# Patient Record
Sex: Male | Born: 1945 | Race: White | Hispanic: No | Marital: Single | Smoking: Former smoker
Health system: Southern US, Community
[De-identification: ages and names within clinical notes are randomized; demographics above are authoritative.]

## PROBLEM LIST (undated history)

## (undated) DIAGNOSIS — F419 Anxiety disorder, unspecified: Secondary | ICD-10-CM

## (undated) DIAGNOSIS — Z978 Presence of other specified devices: Secondary | ICD-10-CM

## (undated) DIAGNOSIS — J449 Chronic obstructive pulmonary disease, unspecified: Secondary | ICD-10-CM

## (undated) DIAGNOSIS — Z960 Presence of urogenital implants: Secondary | ICD-10-CM

## (undated) DIAGNOSIS — R630 Anorexia: Secondary | ICD-10-CM

## (undated) DIAGNOSIS — M1990 Unspecified osteoarthritis, unspecified site: Secondary | ICD-10-CM

## (undated) DIAGNOSIS — R634 Abnormal weight loss: Secondary | ICD-10-CM

## (undated) DIAGNOSIS — IMO0001 Reserved for inherently not codable concepts without codable children: Secondary | ICD-10-CM

## (undated) HISTORY — PX: REPLACEMENT TOTAL KNEE: SUR1224

## (undated) HISTORY — PX: JOINT REPLACEMENT: SHX530

## (undated) HISTORY — DX: Chronic obstructive pulmonary disease, unspecified: J44.9

## (undated) HISTORY — PX: FRACTURE SURGERY: SHX138

## (undated) HISTORY — PX: EYE SURGERY: SHX253

---

## 2011-03-09 ENCOUNTER — Emergency Department (HOSPITAL_COMMUNITY): Payer: Medicare PPO

## 2011-03-09 ENCOUNTER — Encounter (HOSPITAL_COMMUNITY): Payer: Self-pay | Admitting: Emergency Medicine

## 2011-03-09 ENCOUNTER — Emergency Department (HOSPITAL_COMMUNITY)
Admission: EM | Admit: 2011-03-09 | Discharge: 2011-03-09 | Disposition: A | Discharge status: Home / Self Care | Payer: Medicare PPO | Attending: Emergency Medicine | Admitting: Emergency Medicine

## 2011-03-09 DIAGNOSIS — S0100XA Unspecified open wound of scalp, initial encounter: Secondary | ICD-10-CM | POA: Insufficient documentation

## 2011-03-09 DIAGNOSIS — S0990XA Unspecified injury of head, initial encounter: Secondary | ICD-10-CM | POA: Insufficient documentation

## 2011-03-09 DIAGNOSIS — S0101XA Laceration without foreign body of scalp, initial encounter: Secondary | ICD-10-CM

## 2011-03-09 DIAGNOSIS — Z96659 Presence of unspecified artificial knee joint: Secondary | ICD-10-CM | POA: Insufficient documentation

## 2011-03-09 MED ORDER — LIDOCAINE HCL 2 % IJ SOLN
INTRAMUSCULAR | Status: AC
  Filled 2011-03-09: qty 1

## 2011-03-09 MED ORDER — LIDOCAINE-EPINEPHRINE 2 %-1:100000 IJ SOLN: 30.0000 mL | Freq: Once | INTRAMUSCULAR | Status: DC

## 2011-03-09 MED ORDER — TETANUS-DIPHTH-ACELL PERTUSSIS 5-2.5-18.5 LF-MCG/0.5 IM SUSP
0.5000 mL | Freq: Once | INTRAMUSCULAR | Status: AC
  Administered 2011-03-09: 0.5 mL via INTRAMUSCULAR
  Filled 2011-03-09: qty 0.5
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
## 2011-03-09 NOTE — ED Notes (Signed)
Pd notified and patient talked with them about the [place]

## 2011-03-09 NOTE — ED Notes (Signed)
Patient stable upon discharge.  

## 2011-03-09 NOTE — ED Notes (Addendum)
Family friend states that he quits drinking but begins drinking again around [place] day and Memorial day because he lost a lot of friends in [place]. He gets depressed and doesn't have any family around.

## 2011-03-09 NOTE — ED Notes (Signed)
[facility]- [phone] Glynda Jaeger  Please call [facility] to pick pt up.
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
## 2011-03-16 ENCOUNTER — Encounter (HOSPITAL_COMMUNITY): Payer: Self-pay | Admitting: Emergency Medicine

## 2012-07-03 ENCOUNTER — Other Ambulatory Visit: Payer: Self-pay | Admitting: *Deleted

## 2012-07-03 NOTE — Telephone Encounter (Signed)
Patient last seen 04-14-11. Please advise

## 2012-12-04 ENCOUNTER — Other Ambulatory Visit: Payer: Self-pay | Admitting: *Deleted

## 2012-12-09 ENCOUNTER — Encounter (INDEPENDENT_AMBULATORY_CARE_PROVIDER_SITE_OTHER): Payer: Self-pay

## 2012-12-09 ENCOUNTER — Ambulatory Visit (INDEPENDENT_AMBULATORY_CARE_PROVIDER_SITE_OTHER): Payer: Medicare HMO | Admitting: Family Medicine

## 2012-12-09 ENCOUNTER — Encounter: Payer: Self-pay | Admitting: Family Medicine

## 2012-12-09 VITALS — BP 111/71 | HR 72 | Temp 98.5°F | Ht <= 58 in | Wt 111.0 lb

## 2012-12-09 DIAGNOSIS — G4700 Insomnia, unspecified: Secondary | ICD-10-CM

## 2012-12-09 DIAGNOSIS — J449 Chronic obstructive pulmonary disease, unspecified: Secondary | ICD-10-CM

## 2012-12-09 DIAGNOSIS — Z0000 Encounter for general adult medical examination without abnormal findings: Secondary | ICD-10-CM

## 2012-12-09 LAB — POCT CBC
Granulocyte percent: 68.8 %G (ref 37–80)
HCT, POC: 47.2 % (ref 43.5–53.7)
Hemoglobin: 15.8 g/dL (ref 14.1–18.1)
Lymph, poc: 1.6 (ref 0.6–3.4)
MCH, POC: 30.7 pg (ref 27–31.2)
MCHC: 33.4 g/dL (ref 31.8–35.4)
MCV: 91.7 fL (ref 80–97)
MPV: 6.7 fL (ref 0–99.8)
POC Granulocyte: 4.5 (ref 2–6.9)
POC LYMPH PERCENT: 24.2 %L (ref 10–50)
Platelet Count, POC: 268 10*3/uL (ref 142–424)
RBC: 5.2 M/uL (ref 4.69–6.13)
RDW, POC: 13 %
WBC: 6.6 10*3/uL (ref 4.6–10.2)

## 2012-12-09 MED ORDER — MOMETASONE FURO-FORMOTEROL FUM 200-5 MCG/ACT IN AERO: 2.0000 | INHALATION_SPRAY | Freq: Two times a day (BID) | RESPIRATORY_TRACT | Status: DC

## 2012-12-09 MED ORDER — AMITRIPTYLINE HCL 25 MG PO TABS: ORAL_TABLET | ORAL | Status: DC

## 2012-12-09 MED ORDER — IPRATROPIUM-ALBUTEROL 20-100 MCG/ACT IN AERS: 1.0000 | INHALATION_SPRAY | Freq: Four times a day (QID) | RESPIRATORY_TRACT | Status: DC | PRN

## 2012-12-09 NOTE — Patient Instructions (Signed)

## 2012-12-09 NOTE — Progress Notes (Signed)
  Subjective:    Patient ID: [name] Rollins, male    DOB: April 11, 1945, 67 y.o.   MRN: [id number]  HPI This 67 y.o. male presents for evaluation of CPE.  He has hx of copd and states he gets  SOB.  He has been off his Copd medicine dulera.  He states he get out of breath Walking across the room.  He is not on any other copd medicine..   Review of Systems C/o sob No chest pain,  HA, dizziness, vision change, N/V, diarrhea, constipation, dysuria, urinary urgency or frequency, myalgias, arthralgias or rash.     Objective:   Physical Exam Vital signs noted  Well developed well nourished male.  HEENT - Head atraumatic Normocephalic                Eyes - PERRLA, Conjuctiva - clear Sclera- Clear EOMI                Ears - EAC's Wnl TM's Wnl Gross Hearing WNL                Nose - Nares patent                 Throat - oropharanx wnl Respiratory - Lungs diminished throughout. Cardiac - RRR S1 and S2 without murmur GI - Abdomen soft Nontender and bowel sounds active x 4 Extremities - No edema. Neuro - Grossly intact.       Assessment & Plan:  COPD (chronic obstructive pulmonary disease) - Plan: mometasone-formoterol (DULERA) 200-5 MCG/ACT AERO, Ipratropium-Albuterol (COMBIVENT RESPIMAT) 20-100 MCG/ACT AERS respimat  Routine general medical examination at a health care facility - Plan: PSA, total and free, Thyroid Panel With TSH, CMP14+EGFR, POCT CBC, Lipid panel  Insomnia - Plan: amitriptyline (ELAVIL) 25 MG tablet po qhs prn insomnia

## 2012-12-10 LAB — SPECIMEN STATUS REPORT

## 2012-12-10 LAB — CMP14+EGFR
ALT: 25 IU/L (ref 0–44)
AST: 31 IU/L (ref 0–40)
Albumin/Globulin Ratio: 1.1 (ref 1.1–2.5)
Albumin: 4.1 g/dL (ref 3.6–4.8)
Alkaline Phosphatase: 72 IU/L (ref 39–117)
BUN/Creatinine Ratio: 14 (ref 10–22)
BUN: 11 mg/dL (ref 8–27)
CO2: 28 mmol/L (ref 18–29)
Calcium: 9.4 mg/dL (ref 8.6–10.2)
Chloride: 98 mmol/L (ref 97–108)
Creatinine, Ser: 0.77 mg/dL (ref 0.76–1.27)
GFR calc Af Amer: 108 mL/min/{1.73_m2} (ref >59)
GFR calc non Af Amer: 93 mL/min/{1.73_m2} (ref >59)
Globulin, Total: 3.9 g/dL (ref 1.5–4.5)
Glucose: 84 mg/dL (ref 65–99)
Potassium: 4.7 mmol/L (ref 3.5–5.2)
Sodium: 140 mmol/L (ref 134–144)
Total Bilirubin: 0.4 mg/dL (ref 0.0–1.2)
Total Protein: 8 g/dL (ref 6.0–8.5)

## 2012-12-10 LAB — LIPID PANEL
Chol/HDL Ratio: 3.7 ratio units (ref 0.0–5.0)
Cholesterol, Total: 168 mg/dL (ref 100–199)
HDL: 45 mg/dL (ref >39)
LDL Calculated: 94 mg/dL (ref 0–99)
Triglycerides: 145 mg/dL (ref 0–149)
VLDL Cholesterol Cal: 29 mg/dL (ref 5–40)

## 2012-12-10 LAB — PSA, TOTAL AND FREE
PSA, Free Pct: 33.3 %
PSA, Free: 1 ng/mL
PSA: 3 ng/mL (ref 0.0–4.0)

## 2012-12-10 LAB — THYROID PANEL WITH TSH
Free Thyroxine Index: 1.7 (ref 1.2–4.9)
T3 Uptake Ratio: 26 % (ref 24–39)
T4, Total: 6.7 ug/dL (ref 4.5–12.0)
TSH: 2.99 u[IU]/mL (ref 0.450–4.500)

## 2012-12-17 ENCOUNTER — Encounter: Payer: Self-pay | Admitting: *Deleted

## 2012-12-17 NOTE — Progress Notes (Signed)
Quick Note:  Copy of labs sent to patient ______ 

## 2013-06-06 ENCOUNTER — Telehealth: Payer: Self-pay | Admitting: Family Medicine

## 2013-06-06 NOTE — Telephone Encounter (Signed)
He needs to be seen for any type of letter.  I would advise him to just show up at court and inform the clerk of court or whoever is in charge of jury duty that he feels he has an illness preventing him from participating in jury duty.  Sometimes the court will excuse him w/o a letter.  Sometimes the court will not excuse even if he has letter.

## 2013-06-06 NOTE — Telephone Encounter (Signed)
[name] Rollins is this note okay to write? Please advise

## 2013-06-09 NOTE — Telephone Encounter (Signed)
Patient aware.

## 2013-06-19 ENCOUNTER — Telehealth: Payer: Self-pay | Admitting: Family Medicine

## 2013-06-19 NOTE — Telephone Encounter (Signed)
appt made

## 2013-06-20 ENCOUNTER — Encounter: Payer: Self-pay | Admitting: Family Medicine

## 2013-06-20 ENCOUNTER — Ambulatory Visit (INDEPENDENT_AMBULATORY_CARE_PROVIDER_SITE_OTHER): Payer: Medicare HMO

## 2013-06-20 ENCOUNTER — Encounter (INDEPENDENT_AMBULATORY_CARE_PROVIDER_SITE_OTHER): Payer: Self-pay

## 2013-06-20 ENCOUNTER — Ambulatory Visit (INDEPENDENT_AMBULATORY_CARE_PROVIDER_SITE_OTHER): Payer: Medicare HMO | Admitting: Family Medicine

## 2013-06-20 VITALS — BP 134/81 | HR 91 | Temp 97.3°F | Ht 70.0 in | Wt 213.2 lb

## 2013-06-20 DIAGNOSIS — M129 Arthropathy, unspecified: Secondary | ICD-10-CM

## 2013-06-20 DIAGNOSIS — G4700 Insomnia, unspecified: Secondary | ICD-10-CM

## 2013-06-20 DIAGNOSIS — M1990 Unspecified osteoarthritis, unspecified site: Secondary | ICD-10-CM

## 2013-06-20 DIAGNOSIS — F3289 Other specified depressive episodes: Secondary | ICD-10-CM

## 2013-06-20 DIAGNOSIS — R0602 Shortness of breath: Secondary | ICD-10-CM

## 2013-06-20 DIAGNOSIS — J441 Chronic obstructive pulmonary disease with (acute) exacerbation: Secondary | ICD-10-CM

## 2013-06-20 DIAGNOSIS — F32A Depression, unspecified: Secondary | ICD-10-CM

## 2013-06-20 DIAGNOSIS — J449 Chronic obstructive pulmonary disease, unspecified: Secondary | ICD-10-CM

## 2013-06-20 DIAGNOSIS — F329 Major depressive disorder, single episode, unspecified: Secondary | ICD-10-CM

## 2013-06-20 LAB — POCT CBC
Granulocyte percent: 73.3 %G (ref 37–80)
HCT, POC: 51.4 % (ref 43.5–53.7)
Hemoglobin: 16.2 g/dL (ref 14.1–18.1)
Lymph, poc: 1.6 (ref 0.6–3.4)
MCH, POC: 29.4 pg (ref 27–31.2)
MCHC: 31.6 g/dL — AB (ref 31.8–35.4)
MCV: 93 fL (ref 80–97)
MPV: 6.4 fL (ref 0–99.8)
POC Granulocyte: 5.5 (ref 2–6.9)
POC LYMPH PERCENT: 22 %L (ref 10–50)
Platelet Count, POC: 339 10*3/uL (ref 142–424)
RBC: 5.5 M/uL (ref 4.69–6.13)
RDW, POC: 13.9 %
WBC: 7.5 10*3/uL (ref 4.6–10.2)

## 2013-06-20 MED ORDER — PREDNISONE 10 MG PO TABS: ORAL_TABLET | ORAL | Status: DC

## 2013-06-20 MED ORDER — HYDROCODONE-ACETAMINOPHEN 5-325 MG PO TABS: 1.0000 | ORAL_TABLET | Freq: Four times a day (QID) | ORAL | Status: DC | PRN

## 2013-06-20 MED ORDER — IPRATROPIUM BROMIDE 0.02 % IN SOLN: 0.5000 mg | Freq: Four times a day (QID) | RESPIRATORY_TRACT | Status: DC

## 2013-06-20 MED ORDER — METHYLPREDNISOLONE SODIUM SUCC 125 MG IJ SOLR: 125.0000 mg | Freq: Once | INTRAMUSCULAR | Status: DC

## 2013-06-20 MED ORDER — AMITRIPTYLINE HCL 50 MG PO TABS: 50.0000 mg | ORAL_TABLET | Freq: Every day | ORAL | Status: DC

## 2013-06-20 MED ORDER — ALBUTEROL SULFATE (2.5 MG/3ML) 0.083% IN NEBU: 2.5000 mg | INHALATION_SOLUTION | Freq: Four times a day (QID) | RESPIRATORY_TRACT | Status: DC | PRN

## 2013-06-20 MED ORDER — SERTRALINE HCL 50 MG PO TABS: 50.0000 mg | ORAL_TABLET | Freq: Every day | ORAL | Status: DC

## 2013-06-20 MED ORDER — ALPRAZOLAM 0.5 MG PO TABS: 0.5000 mg | ORAL_TABLET | Freq: Every evening | ORAL | Status: DC | PRN

## 2013-06-20 MED ORDER — IPRATROPIUM-ALBUTEROL 0.5-2.5 (3) MG/3ML IN SOLN: 3.0000 mL | RESPIRATORY_TRACT | Status: DC

## 2013-06-20 MED ORDER — IPRATROPIUM-ALBUTEROL 0.5-2.5 (3) MG/3ML IN SOLN
3.0000 mL | Freq: Once | RESPIRATORY_TRACT | Status: AC
  Administered 2013-06-20: 3 mL via RESPIRATORY_TRACT

## 2013-06-20 MED ORDER — AMOXICILLIN 875 MG PO TABS: 875.0000 mg | ORAL_TABLET | Freq: Two times a day (BID) | ORAL | Status: DC

## 2013-06-20 MED ORDER — METHYLPREDNISOLONE SODIUM SUCC 125 MG IJ SOLR
125.0000 mg | Freq: Once | INTRAMUSCULAR | Status: AC
  Administered 2013-06-20: 125 mg via INTRAMUSCULAR

## 2013-06-20 NOTE — Progress Notes (Signed)
   Subjective:    Patient ID: Kirby Cortese, male    DOB: 11/15/1945, 68 y.o.   MRN: [id number]  HPI This 68 y.o. male presents for evaluation of shortness of breath, weakness, depression, insomnia, bilateral knee pain, and he is having difficulty with getting out of breath and he cannot ambulate due to his breathing.  He is using combivent and has no meds for nebulizer. Review of Systems C/o SOB, anxiety, depression, bilateral knee pain   No chest pain, HA, dizziness, vision change, N/V, diarrhea, constipation, dysuria, urinary urgency or frequency or rash.  Objective:   Physical Exam  Vital signs noted  Chronically ill appearing male  HEENT - Head atraumatic Normocephalic                Eyes - PERRLA, Conjuctiva - clear Sclera- Clear EOMI                Ears - EAC's Wnl TM's Wnl Gross Hearing WNL                Nose - Nares patent                 Throat - oropharanx wnl Respiratory - Lungs CTA diminished throughout and oxygen saturation 87% with ambulation And patient HR increases to 140 and he is gasping for breath and using upper accessory muscles. Cardiac - RRR S1 and S2 without murmur GI - Abdomen soft Nontender and bowel sounds active x 4 Extremities - No edema. Neuro - Grossly intact.      Assessment & Plan:  COPD exacerbation - Plan: Ambulatory referral to Pulmonology, predniSONE (DELTASONE) 10 MG tablet, amoxicillin (AMOXIL) 875 MG tablet, ipratropium (ATROVENT) 0.02 % nebulizer solution, albuterol (PROVENTIL) (2.5 MG/3ML) 0.083% nebulizer solution  Insomnia - Plan: amitriptyline (ELAVIL) 50 MG tablet, ALPRAZolam (XANAX) 0.5 MG tablet  Depression - Plan: sertraline (ZOLOFT) 50 MG tablet, ALPRAZolam (XANAX) 0.5 MG tablet, POCT CBC, CMP14+EGFR  SOB (shortness of breath) - Plan: Ambulatory referral to Pulmonology, predniSONE (DELTASONE) 10 MG tablet, amoxicillin (AMOXIL) 875 MG tablet, ipratropium (ATROVENT) 0.02 % nebulizer solution, albuterol (PROVENTIL) (2.5 MG/3ML)  0.083% nebulizer solution, DG Chest 2 View, POCT CBC, CMP14+EGFR  Arthritis - Plan: HYDROcodone-acetaminophen (NORCO) 5-325 MG per tablet, amitriptyline (ELAVIL) 50 MG tablet, POCT CBC, CMP14+EGFR  Lysbeth Penner FNP

## 2013-06-21 LAB — CMP14+EGFR
ALT: 24 IU/L (ref 0–44)
AST: 32 IU/L (ref 0–40)
Albumin/Globulin Ratio: 1 — ABNORMAL LOW (ref 1.1–2.5)
Albumin: 3.9 g/dL (ref 3.6–4.8)
Alkaline Phosphatase: 84 IU/L (ref 39–117)
BUN/Creatinine Ratio: 10 (ref 10–22)
BUN: 8 mg/dL (ref 8–27)
CO2: 24 mmol/L (ref 18–29)
Calcium: 9.2 mg/dL (ref 8.6–10.2)
Chloride: 97 mmol/L (ref 97–108)
Creatinine, Ser: 0.81 mg/dL (ref 0.76–1.27)
GFR calc Af Amer: 106 mL/min/{1.73_m2} (ref >59)
GFR calc non Af Amer: 91 mL/min/{1.73_m2} (ref >59)
Globulin, Total: 4.1 g/dL (ref 1.5–4.5)
Glucose: 88 mg/dL (ref 65–99)
Potassium: 4.4 mmol/L (ref 3.5–5.2)
Sodium: 135 mmol/L (ref 134–144)
Total Bilirubin: 0.5 mg/dL (ref 0.0–1.2)
Total Protein: 8 g/dL (ref 6.0–8.5)

## 2013-07-04 ENCOUNTER — Ambulatory Visit (INDEPENDENT_AMBULATORY_CARE_PROVIDER_SITE_OTHER): Payer: Medicare HMO

## 2013-07-04 ENCOUNTER — Other Ambulatory Visit: Payer: Self-pay | Admitting: *Deleted

## 2013-07-04 ENCOUNTER — Encounter: Payer: Self-pay | Admitting: Family Medicine

## 2013-07-04 ENCOUNTER — Ambulatory Visit (INDEPENDENT_AMBULATORY_CARE_PROVIDER_SITE_OTHER): Payer: Medicare HMO | Admitting: Family Medicine

## 2013-07-04 VITALS — BP 135/83 | HR 102 | Temp 97.4°F | Ht 70.0 in | Wt 218.0 lb

## 2013-07-04 DIAGNOSIS — M25569 Pain in unspecified knee: Secondary | ICD-10-CM

## 2013-07-04 DIAGNOSIS — M1990 Unspecified osteoarthritis, unspecified site: Secondary | ICD-10-CM

## 2013-07-04 DIAGNOSIS — M129 Arthropathy, unspecified: Secondary | ICD-10-CM

## 2013-07-04 DIAGNOSIS — J449 Chronic obstructive pulmonary disease, unspecified: Secondary | ICD-10-CM

## 2013-07-04 MED ORDER — IPRATROPIUM-ALBUTEROL 20-100 MCG/ACT IN AERS: 1.0000 | INHALATION_SPRAY | Freq: Four times a day (QID) | RESPIRATORY_TRACT | Status: DC | PRN

## 2013-07-04 MED ORDER — HYDROCODONE-IBUPROFEN 10-200 MG PO TABS: 10.0000 mg | ORAL_TABLET | Freq: Four times a day (QID) | ORAL | Status: DC | PRN

## 2013-07-04 MED ORDER — MOMETASONE FURO-FORMOTEROL FUM 200-5 MCG/ACT IN AERO: 2.0000 | INHALATION_SPRAY | Freq: Two times a day (BID) | RESPIRATORY_TRACT | Status: DC

## 2013-07-04 MED ORDER — HYDROCODONE-IBUPROFEN 7.5-200 MG PO TABS: 1.0000 | ORAL_TABLET | Freq: Three times a day (TID) | ORAL | Status: DC | PRN

## 2013-07-04 NOTE — Progress Notes (Signed)
   Subjective:    Patient ID: [name] Rollins, male    DOB: [birth year], 68 y.o.   MRN: [id number]  HPI This 68 y.o. male presents for evaluation of Copd exacerbation.  He was having difficulty last visit With his breathing and was having difficulty walking across the room without being severely SOB. He has DJD of the knees and hx of S/p right knee replacement and has severe pain in both knees. He was started on norco 5mg  po one to two po qid prn and he is taking 2 po tid for pain which is down from severe to moderate.  He is able to walk a little better.  He is breathing better.  He is still on prednisone taper.  He is needing refill on dulera and combivent MDI.   Review of Systems C/o sob and bilateral knee pain No chest pain, SOB, HA, dizziness, vision change, N/V, diarrhea, constipation, dysuria, urinary urgency or frequency, myalgias, arthralgias or rash.     Objective:   Physical Exam Vital signs noted  Chronically ill appearing male.  HEENT - Head atraumatic Normocephalic                Eyes - PERRLA, Conjuctiva - clear Sclera- Clear EOMI                Ears - EAC's Wnl TM's Wnl Gross Hearing WNL                Nose - Nares patent                 Throat - oropharanx wnl Respiratory - Lungs with diminished BS throughout Cardiac - RRR S1 and S2 without murmur GI - Abdomen soft Nontender and bowel sounds active x 4 Extremities - No edema. Neuro - Grossly intact. MS - TTP bilateral knees  Xray of left knee - arthritis Xray of right knee - S/p replacement    Assessment & Plan:  COPD (chronic obstructive pulmonary disease) - Plan: Ipratropium-Albuterol (COMBIVENT RESPIMAT) 20-100 MCG/ACT AERS respimat, mometasone-formoterol (DULERA) 200-5 MCG/ACT AERO, DISCONTINUED: mometasone-formoterol (DULERA) 200-5 MCG/ACT AERO, DISCONTINUED: Ipratropium-Albuterol (COMBIVENT RESPIMAT) 20-100 MCG/ACT AERS respimat Follow up with pulmonary  Arthritis - Plan: DG Knee 1-2 Views Left, DG Knee 1-2  Views Right  Knee pain - Plan: DG Knee 1-2 Views Left, DG Knee 1-2 Views Right, Ambulatory referral to Orthopedic Surgery, Hydrocodone-Ibuprofen 10-200 MG TABS Continue Elavil qhs   Follow up in 3 months or prn  Deatra Canter FNP

## 2013-07-10 ENCOUNTER — Encounter: Payer: Self-pay | Admitting: Emergency Medicine

## 2013-07-10 ENCOUNTER — Ambulatory Visit (INDEPENDENT_AMBULATORY_CARE_PROVIDER_SITE_OTHER): Payer: Medicare PPO | Admitting: Emergency Medicine

## 2013-07-10 VITALS — BP 130/84 | HR 99 | Ht 72.0 in | Wt 214.2 lb

## 2013-07-10 DIAGNOSIS — J449 Chronic obstructive pulmonary disease, unspecified: Secondary | ICD-10-CM

## 2013-07-10 DIAGNOSIS — R0902 Hypoxemia: Secondary | ICD-10-CM

## 2013-07-10 DIAGNOSIS — Z720 Tobacco use: Secondary | ICD-10-CM

## 2013-07-10 DIAGNOSIS — F17200 Nicotine dependence, unspecified, uncomplicated: Secondary | ICD-10-CM

## 2013-07-10 NOTE — Assessment & Plan Note (Signed)
Discussed cessation today. Will work further on making a plan next visit

## 2013-07-10 NOTE — Patient Instructions (Signed)
We will start oxygen at 2L/min with exertion and walking.  Please continue your albuterol / ipratropium nebulizers (DuoNebs) 4 times a day Stop Dulera and combivent for now, we may decide to restart in the future.  We will perform full pulmonary function testing at your next visit Continue to work on cutting down your cigarettes. We will discuss stopping altogether at your next visit Follow with Dr Delton Coombes in 1 month with full PFT

## 2013-07-10 NOTE — Assessment & Plan Note (Signed)
Start ambulatory oxygen today

## 2013-07-10 NOTE — Assessment & Plan Note (Signed)
Suspect severe disease. He desats on ambulation today. Has been on BD's but variable compliance.  - simplify BD regimen, change duonebs to qid - start o2 - full PFT - rov 1

## 2013-07-10 NOTE — Progress Notes (Signed)
Subjective:    Patient ID: [name] Rollins, male    DOB: 20-Apr-1945, 68 y.o.   MRN: [id number]  HPI 68 yo smoker (100 pk-yrs, currently 1/2 pk/day) with hx OA s/p R knee sgy '93. Referred by Dr Purcell Nails for COPD that was originally dx clinically and by CXR about a year ago. He has haas progressive exertional dyspnea and am dyspnea when he gets up from bed. He has been quite limited by breathing for ~3 years. He coughs occasionally. Sometimes wheezes when he lays flat. He was recently seen and treated for an AE with abx and pred, was started on dulera bid, combivent bid.    Walking oximetry today > desat to 88%.    Review of Systems  Constitutional: Negative for fever and unexpected weight change.  HENT: Positive for congestion. Negative for dental problem, ear pain, nosebleeds, postnasal drip, rhinorrhea, sinus pressure, sneezing, sore throat and trouble swallowing.   Eyes: Negative for redness and itching.  Respiratory: Positive for cough, shortness of breath and wheezing. Negative for chest tightness.   Cardiovascular: Negative for palpitations and leg swelling.  Gastrointestinal: Negative for nausea and vomiting.  Genitourinary: Negative for dysuria.  Musculoskeletal: Negative for joint swelling.  Skin: Negative for rash.  Neurological: Negative for headaches.  Hematological: Bruises/bleeds easily.  Psychiatric/Behavioral: Negative for dysphoric mood. The patient is not nervous/anxious.    Past Medical History  Diagnosis Date  . COPD (chronic obstructive pulmonary disease)      Family History  Problem Relation Age of Onset  . Cancer Father     pancreatic  . Diabetes Brother   . Diabetes Brother      History   Social History  . Marital Status: Single    Spouse Name: N/A    Number of Children: N/A  . Years of Education: N/A   Occupational History  . Not on file.   Social History Main Topics  . Smoking status: Current Every Day Smoker -- 0.50 packs/day for 50 years   Types: Cigarettes  . Smokeless tobacco: Never Used  . Alcohol Use: Yes     Comment: occ  . Drug Use: No  . Sexual Activity: Not on file   Other Topics Concern  . Not on file   Social History Narrative  . No narrative on file  has worked as   Allergies  Allergen Reactions  . Codeine      Outpatient Prescriptions Prior to Visit  Medication Sig Dispense Refill  . albuterol (PROVENTIL) (2.5 MG/3ML) 0.083% nebulizer solution Take 3 mLs (2.5 mg total) by nebulization every 6 (six) hours as needed for wheezing or shortness of breath.  150 mL  3  . ALPRAZolam (XANAX) 0.5 MG tablet Take 1 tablet (0.5 mg total) by mouth at bedtime as needed for anxiety.  30 tablet  3  . amitriptyline (ELAVIL) 50 MG tablet Take 1 tablet (50 mg total) by mouth at bedtime.  30 tablet  11  . amoxicillin (AMOXIL) 875 MG tablet Take 1 tablet (875 mg total) by mouth 2 (two) times daily.  20 tablet  0  . HYDROcodone-acetaminophen (NORCO) 5-325 MG per tablet Take 1-2 tablets by mouth every 6 (six) hours as needed for moderate pain.  60 tablet  0  . Ipratropium-Albuterol (COMBIVENT RESPIMAT) 20-100 MCG/ACT AERS respimat Inhale 1 puff into the lungs every 6 (six) hours as needed for wheezing.  1 Inhaler  11  . mometasone-formoterol (DULERA) 200-5 MCG/ACT AERO Inhale 2 puffs into the lungs 2 (  two) times daily.  1 Inhaler  11  . sertraline (ZOLOFT) 50 MG tablet Take 1 tablet (50 mg total) by mouth daily.  30 tablet  11  . ibuprofen (ADVIL,MOTRIN) 200 MG tablet Take 200 mg by mouth every 6 (six) hours as needed for pain.      Marland Kitchen. ipratropium (ATROVENT) 0.02 % nebulizer solution Take 2.5 mLs (0.5 mg total) by nebulization 4 (four) times daily.  75 mL  12  . HYDROcodone-ibuprofen (VICOPROFEN) 7.5-200 MG per tablet Take 1 tablet by mouth every 8 (eight) hours as needed for moderate pain.  90 tablet  0  . Hydrocodone-Ibuprofen 10-200 MG TABS Take 10 mg by mouth 4 (four) times daily as needed.  90 each  0  . predniSONE (DELTASONE)  10 MG tablet Take 4 po qd x 3days then 3 po qd x 3 days then 2 po qd x 3 days then 1 po qd x 3 days then stop  30 tablet  0   Facility-Administered Medications Prior to Visit  Medication Dose Route Frequency Provider Last Rate Last Dose  . ipratropium-albuterol (DUONEB) 0.5-2.5 (3) MG/3ML nebulizer solution 3 mL  3 mL Nebulization Q4H Deatra CanterWilliam J Oxford, FNP             Objective:   Physical Exam Filed Vitals:   07/10/13 1420  BP: 130/84  Pulse: 99  Height: 6' (1.829 m)  Weight: 214 lb 3.2 oz (97.16 kg)  SpO2: 93%  Gen: Pleasant, well-nourished, in no distress,  normal affect  ENT: No lesions,  mouth clear,  oropharynx clear, no postnasal drip  Neck: No JVD, no TMG, no carotid bruits  Lungs: No use of accessory muscles, clear without rales or rhonchi, soft B exertional wheeze  Cardiovascular: RRR, heart sounds normal, no murmur or gallops, no peripheral edema  Musculoskeletal: No deformities, no cyanosis or clubbing  Neuro: alert, non focal  Skin: Warm, no lesions or rashes      Assessment & Plan:  Tobacco abuse Discussed cessation today. Will work further on making a plan next visit  Exercise hypoxemia Start ambulatory oxygen today  COPD (chronic obstructive pulmonary disease) Suspect severe disease. He desats on ambulation today. Has been on BD's but variable compliance.  - simplify BD regimen, change duonebs to qid - start o2 - full PFT - rov 1

## 2013-08-15 ENCOUNTER — Ambulatory Visit (INDEPENDENT_AMBULATORY_CARE_PROVIDER_SITE_OTHER): Payer: Commercial Managed Care - HMO | Admitting: Emergency Medicine

## 2013-08-15 ENCOUNTER — Encounter: Payer: Self-pay | Admitting: Emergency Medicine

## 2013-08-15 VITALS — BP 130/76 | HR 105 | Ht 70.0 in | Wt 203.0 lb

## 2013-08-15 DIAGNOSIS — J449 Chronic obstructive pulmonary disease, unspecified: Secondary | ICD-10-CM

## 2013-08-15 DIAGNOSIS — J441 Chronic obstructive pulmonary disease with (acute) exacerbation: Secondary | ICD-10-CM

## 2013-08-15 NOTE — Progress Notes (Signed)
PFT done today. 

## 2013-08-15 NOTE — Progress Notes (Signed)
   Subjective:    Patient ID: [name] Rollins, male    DOB: 1945/10/27, 68 y.o.   MRN: [id number]  HPI 68 yo smoker (100 pk-yrs, currently 1/2 pk/day) with hx OA s/p R knee sgy '93. Referred by Dr Purcell Nailsxford for COPD that was originally dx clinically and by CXR about a year ago. He has haas progressive exertional dyspnea and am dyspnea when he gets up from bed. He has been quite limited by breathing for ~3 years. He coughs occasionally. Sometimes wheezes when he lays flat. He was recently seen and treated for an AE with abx and pred, was started on dulera bid, combivent bid.    Walking oximetry today > desat to 88%.   ROV 08/15/13 -- follows up for tobacco, COPD now on DuoNebs. Last time we started O2 w exertion. 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 He hasn't worn his O2 yet - has been having problems with the system and filling the tank. We changed to duonebs last time, but he has not been using. He used the [place] and combivent prn just as he was before.    Review of Systems  Constitutional: Negative for fever and unexpected weight change.  HENT: Positive for congestion. Negative for dental problem, ear pain, nosebleeds, postnasal drip, rhinorrhea, sinus pressure, sneezing, sore throat and trouble swallowing.   Eyes: Negative for redness and itching.  Respiratory: Positive for cough, shortness of breath and wheezing. Negative for chest tightness.   Cardiovascular: Negative for palpitations and leg swelling.  Gastrointestinal: Negative for nausea and vomiting.  Genitourinary: Negative for dysuria.  Musculoskeletal: Negative for joint swelling.  Skin: Negative for rash.  Neurological: Negative for headaches.  Hematological: Bruises/bleeds easily.  Psychiatric/Behavioral: Negative for dysphoric mood. The patient is not nervous/anxious.         Objective:   Physical Exam Filed Vitals:   08/15/13 1322  BP: 130/76  Pulse: 105  Height: 5'  10" (1.778 m)  Weight: 203 lb (92.08 kg)  SpO2: 93%  Gen: Pleasant, obese in no distress,  normal affect, smells of smoke  ENT: No lesions,  mouth clear,  oropharynx clear, no postnasal drip  Neck: No JVD, no TMG, no carotid bruits  Lungs: No use of accessory muscles, no wheeze, very distant  Cardiovascular: RRR, heart sounds normal, no murmur or gallops, no peripheral edema  Musculoskeletal: No deformities, no cyanosis or clubbing  Neuro: alert, non focal  Skin: Warm, no lesions or rashes      Assessment & Plan:  COPD (chronic obstructive pulmonary disease) 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 He hasn't done any of the things we talked about - still using dulera and combivent, not duonebs. hasnpt stopped smoking. Hasn't used o2 due to technical trouble - also concerned that if he is going to pay a co-pay that he wants the O2 picked back up. Will try to clarify with [employer]. Discussed smoking cessation today.

## 2013-08-15 NOTE — Addendum Note (Signed)
Addended by: Gwynneth AlimentLAWRENCE, Demetric Parslow A on: 08/15/2013 02:02 PM   Modules accepted: Orders

## 2013-08-15 NOTE — Patient Instructions (Signed)
We will stop Dulera and combivent.  Try to work hard on stopping smoking  Use your albuterol / ipratropium nebulizer four times a day.  We will check to see if you can get new oxygen tanks, whether you have to continue to pay a co-pay. Follow with Dr Delton CoombesByrum in 4 months or sooner if you have any problems.

## 2013-08-15 NOTE — Assessment & Plan Note (Signed)
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 He hasn't done any of the things we talked about - still using dulera and combivent, not duonebs. hasnpt stopped smoking. Hasn't used o2 due to technical trouble - also concerned that if he is going to pay a co-pay that he wants the O2 picked back up. Will try to clarify with [employer]. Discussed smoking cessation today.

## 2013-08-27 LAB — PULMONARY FUNCTION TEST
DL/VA % pred: 40 %
DL/VA: 1.85 ml/min/mmHg/L
DLCO unc % pred: 34 %
DLCO unc: 11.25 ml/min/mmHg
FEF 25-75 PRE: 0.55 L/s
FEF 25-75 Post: 0.54 L/sec
FEF2575-%CHANGE-POST: 0 %
FEF2575-%PRED-POST: 21 %
FEF2575-%Pred-Pre: 21 %
FEV1-%CHANGE-POST: 2 %
FEV1-%PRED-POST: 42 %
FEV1-%PRED-PRE: 41 %
FEV1-POST: 1.41 L
FEV1-PRE: 1.37 L
FEV1FVC-%CHANGE-POST: 3 %
FEV1FVC-%PRED-PRE: 55 %
FEV6-%Change-Post: -1 %
FEV6-%Pred-Post: 73 %
FEV6-%Pred-Pre: 75 %
FEV6-PRE: 3.17 L
FEV6-Post: 3.12 L
FEV6FVC-%Change-Post: -1 %
FEV6FVC-%PRED-PRE: 100 %
FEV6FVC-%Pred-Post: 99 %
FVC-%Change-Post: 0 %
FVC-%Pred-Post: 74 %
FVC-%Pred-Pre: 75 %
FVC-Post: 3.35 L
FVC-Pre: 3.36 L
POST FEV1/FVC RATIO: 42 %
POST FEV6/FVC RATIO: 93 %
PRE FEV6/FVC RATIO: 95 %
Pre FEV1/FVC ratio: 41 %
RV % PRED: 205 %
RV: 5 L
TLC % pred: 127 %
TLC: 8.94 L

## 2013-10-15 ENCOUNTER — Other Ambulatory Visit: Payer: Self-pay | Admitting: Family Medicine

## 2013-10-15 DIAGNOSIS — M1990 Unspecified osteoarthritis, unspecified site: Secondary | ICD-10-CM

## 2013-10-15 MED ORDER — HYDROCODONE-ACETAMINOPHEN 5-325 MG PO TABS: 1.0000 | ORAL_TABLET | Freq: Four times a day (QID) | ORAL | Status: DC | PRN

## 2013-10-15 NOTE — Telephone Encounter (Signed)
Last filled 06/20/13, last seen 07/04/13. Rx will print

## 2013-11-24 ENCOUNTER — Other Ambulatory Visit: Payer: Self-pay | Admitting: Family Medicine

## 2013-11-25 NOTE — Telephone Encounter (Signed)
Last refill 10/26/13. Last ov 07/04/13. Route to nurse pool. [facility]

## 2013-11-28 ENCOUNTER — Telehealth: Payer: Self-pay | Admitting: Family Medicine

## 2013-12-01 ENCOUNTER — Telehealth: Payer: Self-pay | Admitting: Family Medicine

## 2013-12-01 ENCOUNTER — Other Ambulatory Visit: Payer: Self-pay | Admitting: Family Medicine

## 2013-12-01 MED ORDER — ALPRAZOLAM 0.5 MG PO TABS: 0.5000 mg | ORAL_TABLET | Freq: Two times a day (BID) | ORAL | Status: DC | PRN

## 2013-12-01 NOTE — Telephone Encounter (Signed)
Xanax already approved and called in to pharmacy today. For hydrocodone last office visit was 07-04-13. Last refill was 10-15-13 for #[phone]. Please advise. If approved please print rx and route to Nurses station A so patient can be called to come and pick up

## 2013-12-02 ENCOUNTER — Other Ambulatory Visit: Payer: Self-pay | Admitting: *Deleted

## 2013-12-02 DIAGNOSIS — J441 Chronic obstructive pulmonary disease with (acute) exacerbation: Secondary | ICD-10-CM

## 2013-12-02 DIAGNOSIS — R0602 Shortness of breath: Secondary | ICD-10-CM

## 2013-12-02 MED ORDER — ALBUTEROL SULFATE (2.5 MG/3ML) 0.083% IN NEBU: 2.5000 mg | INHALATION_SOLUTION | Freq: Four times a day (QID) | RESPIRATORY_TRACT | Status: DC | PRN

## 2013-12-02 NOTE — Telephone Encounter (Signed)
Pt needs refill on neb med. Xanax already refilled. Pain med refill was sent  to you for review on 10/23. Can you review please?

## 2013-12-03 ENCOUNTER — Other Ambulatory Visit: Payer: Self-pay | Admitting: Family Medicine

## 2013-12-03 DIAGNOSIS — M1990 Unspecified osteoarthritis, unspecified site: Secondary | ICD-10-CM

## 2013-12-03 MED ORDER — HYDROCODONE-ACETAMINOPHEN 5-325 MG PO TABS: 1.0000 | ORAL_TABLET | Freq: Four times a day (QID) | ORAL | Status: DC | PRN

## 2013-12-30 ENCOUNTER — Telehealth: Payer: Self-pay | Admitting: Family Medicine

## 2013-12-30 NOTE — Telephone Encounter (Signed)
Pt notified [employer] for med refills

## 2014-01-07 ENCOUNTER — Encounter: Payer: Self-pay | Admitting: Family Medicine

## 2014-01-07 ENCOUNTER — Ambulatory Visit (INDEPENDENT_AMBULATORY_CARE_PROVIDER_SITE_OTHER): Payer: Medicare HMO | Admitting: Family Medicine

## 2014-01-07 VITALS — BP 107/75 | HR 81 | Temp 96.8°F | Ht 70.0 in | Wt 200.0 lb

## 2014-01-07 DIAGNOSIS — J441 Chronic obstructive pulmonary disease with (acute) exacerbation: Secondary | ICD-10-CM

## 2014-01-07 DIAGNOSIS — J410 Simple chronic bronchitis: Secondary | ICD-10-CM

## 2014-01-07 DIAGNOSIS — R0602 Shortness of breath: Secondary | ICD-10-CM

## 2014-01-07 DIAGNOSIS — M1990 Unspecified osteoarthritis, unspecified site: Secondary | ICD-10-CM

## 2014-01-07 DIAGNOSIS — F411 Generalized anxiety disorder: Secondary | ICD-10-CM

## 2014-01-07 MED ORDER — ALBUTEROL SULFATE (2.5 MG/3ML) 0.083% IN NEBU: 2.5000 mg | INHALATION_SOLUTION | Freq: Four times a day (QID) | RESPIRATORY_TRACT | Status: DC | PRN

## 2014-01-07 MED ORDER — ALBUTEROL SULFATE HFA 108 (90 BASE) MCG/ACT IN AERS: 2.0000 | INHALATION_SPRAY | Freq: Four times a day (QID) | RESPIRATORY_TRACT | Status: DC | PRN

## 2014-01-07 MED ORDER — ALPRAZOLAM 0.5 MG PO TABS: 0.5000 mg | ORAL_TABLET | Freq: Two times a day (BID) | ORAL | Status: DC | PRN

## 2014-01-07 MED ORDER — HYDROCODONE-ACETAMINOPHEN 5-325 MG PO TABS: 1.0000 | ORAL_TABLET | Freq: Four times a day (QID) | ORAL | Status: DC | PRN

## 2014-01-07 NOTE — Progress Notes (Signed)
   Subjective:    Patient ID: [name] Rollins, male    DOB: [birth year], 68 y.o.   MRN: [id number]  HPI Patient is here for c/o anxiety and chronic pain.  He needs refills on his norco and xanax.  He has no changes.  Review of Systems  Constitutional: Negative for fever.  HENT: Negative for ear pain.   Eyes: Negative for discharge.  Respiratory: Negative for cough.   Cardiovascular: Negative for chest pain.  Gastrointestinal: Negative for abdominal distention.  Endocrine: Negative for polyuria.  Genitourinary: Negative for difficulty urinating.  Musculoskeletal: Negative for gait problem and neck pain.  Skin: Negative for color change and rash.  Neurological: Negative for speech difficulty and headaches.  Psychiatric/Behavioral: Negative for agitation.       Objective:    BP 107/75 mmHg  Pulse 81  Temp(Src) 96.8 F (36 C) (Oral)  Ht 5\' 10"  (1.778 m)  Wt 200 lb (90.719 kg)  BMI 28.70 kg/m2 Physical Exam  Constitutional: He is oriented to person, place, and time. He appears well-developed and well-nourished.  HENT:  Head: Normocephalic and atraumatic.  Mouth/Throat: Oropharynx is clear and moist.  Eyes: Pupils are equal, round, and reactive to light.  Neck: Normal range of motion. Neck supple.  Cardiovascular: Normal rate and regular rhythm.   No murmur heard. Pulmonary/Chest: Effort normal and breath sounds normal.  Abdominal: Soft. Bowel sounds are normal. There is no tenderness.  Neurological: He is alert and oriented to person, place, and time.  Skin: Skin is warm and dry.  Psychiatric: He has a normal mood and affect.          Assessment & Plan:     ICD-9-CM ICD-10-CM   1. Arthritis 716.90 M19.90 HYDROcodone-acetaminophen (NORCO) 5-325 MG per tablet     DISCONTINUED: HYDROcodone-acetaminophen (NORCO) 5-325 MG per tablet  2. Anxiety state 300.00 F41.1 ALPRAZolam (XANAX) 0.5 MG tablet     DISCONTINUED: ALPRAZolam (XANAX) 0.5 MG tablet     DISCONTINUED:  ALPRAZolam (XANAX) 0.5 MG tablet  3. Simple chronic bronchitis 491.0 J41.0 albuterol (PROVENTIL HFA;VENTOLIN HFA) 108 (90 BASE) MCG/ACT inhaler  4. COPD exacerbation 491.21 J44.1 albuterol (PROVENTIL) (2.5 MG/3ML) 0.083% nebulizer solution  5. SOB (shortness of breath) 786.05 R06.02 albuterol (PROVENTIL) (2.5 MG/3ML) 0.083% nebulizer solution     Return in about 3 months (around 04/08/2014).  Deatra CanterWilliam J Oxford FNP

## 2014-02-03 ENCOUNTER — Telehealth: Payer: Self-pay | Admitting: Family Medicine

## 2014-02-03 NOTE — Telephone Encounter (Signed)
Appointment scheduled for tomorrow at 10:45 with Oxford.

## 2014-02-04 ENCOUNTER — Encounter: Payer: Self-pay | Admitting: Family Medicine

## 2014-02-04 ENCOUNTER — Ambulatory Visit (INDEPENDENT_AMBULATORY_CARE_PROVIDER_SITE_OTHER): Payer: Medicare HMO | Admitting: Family Medicine

## 2014-02-04 VITALS — BP 118/79 | HR 90 | Temp 96.8°F | Ht 70.0 in | Wt 194.0 lb

## 2014-02-04 DIAGNOSIS — N139 Obstructive and reflux uropathy, unspecified: Secondary | ICD-10-CM

## 2014-02-04 MED ORDER — CIPROFLOXACIN HCL 500 MG PO TABS: 500.0000 mg | ORAL_TABLET | Freq: Two times a day (BID) | ORAL | Status: DC

## 2014-02-04 MED ORDER — TAMSULOSIN HCL 0.4 MG PO CAPS: 0.4000 mg | ORAL_CAPSULE | Freq: Every day | ORAL | Status: DC

## 2014-02-04 NOTE — Progress Notes (Signed)
   Subjective:    Patient ID: [name] Rollins, male    DOB: Jul 16, 1945, 68 y.o.   MRN: [id number]  HPI Patient is here for follow up from ED on 01/30/14 due to obstructive uropathy.  He has an indwelling foley catheter.  He is on no meds and does not have urology referral.  Review of Systems  Constitutional: Negative for fever.  HENT: Negative for ear pain.   Eyes: Negative for discharge.  Respiratory: Negative for cough.   Cardiovascular: Negative for chest pain.  Gastrointestinal: Negative for abdominal distention.  Endocrine: Negative for polyuria.  Genitourinary: Negative for difficulty urinating.  Musculoskeletal: Negative for gait problem and neck pain.  Skin: Negative for color change and rash.  Neurological: Negative for speech difficulty and headaches.  Psychiatric/Behavioral: Negative for agitation.       Objective:    BP 118/79 mmHg  Pulse 90  Temp(Src) 96.8 F (36 C) (Oral)  Ht 5\' 10"  (1.778 m)  Wt 194 lb (87.998 kg)  BMI 27.84 kg/m2   Physical Exam  Constitutional: He is oriented to person, place, and time. He appears well-developed and well-nourished.  HENT:  Head: Normocephalic and atraumatic.  Mouth/Throat: Oropharynx is clear and moist.  Eyes: Pupils are equal, round, and reactive to light.  Neck: Normal range of motion. Neck supple.  Cardiovascular: Normal rate and regular rhythm.   No murmur heard. Pulmonary/Chest: Effort normal and breath sounds normal.  Abdominal: Soft. Bowel sounds are normal. There is no tenderness.  Neurological: He is alert and oriented to person, place, and time.  Skin: Skin is warm and dry.  Psychiatric: He has a normal mood and affect.          Assessment & Plan:     ICD-9-CM ICD-10-CM   1. Obstructive uropathy 599.60 N13.9 tamsulosin (FLOMAX) 0.4 MG CAPS capsule     ciprofloxacin (CIPRO) 500 MG tablet     Ambulatory referral to Urology     No Follow-up on file.  Deatra CanterWilliam J Oxford FNP

## 2014-02-09 ENCOUNTER — Telehealth: Payer: Self-pay | Admitting: *Deleted

## 2014-02-09 NOTE — Telephone Encounter (Signed)
Spoke with pt- he states he has not seen urology, and still has catheter in place.  Per Lennox Grumbles [facility] was trying to schedule patient 02/05/14, and was contacting patient in regards to scheduling.  Patient did not get the call, but states he could have been asleep.  Advised patient to call [facility] to try to get an appointment ASAP.  Called patient to make sure he got an appointment.  He states they gave him an appointment for 02/12/14 at 8am, but patient has transportation for after 4:30 pm that day.  Advised patient it is very important to go to this appointment, and try his best to get transportation.  Per Ander Slade, if patient cannot get to appointment he can come to [facility] Thursday afternoon to be seen.  Annette Stable does not want to remove catheter unless patient has UTI symptoms, which patient denies.  Bill states patient's urinary retention will likely return if he removes catheter, thus it is very important for patient to see urology ASAP.  Relayed this message to patient, and he expressed understanding.  Asked patient to call back if he has questions or concerns.

## 2014-02-10 ENCOUNTER — Encounter: Payer: Self-pay | Admitting: *Deleted

## 2014-02-10 ENCOUNTER — Telehealth: Payer: Self-pay | Admitting: Family Medicine

## 2014-02-10 NOTE — Telephone Encounter (Signed)
???   Has these been done. Looks like they were printed and nothing is due but pain med?

## 2014-02-12 ENCOUNTER — Telehealth: Payer: Self-pay | Admitting: Family Medicine

## 2014-02-12 ENCOUNTER — Other Ambulatory Visit: Payer: Self-pay | Admitting: Family Medicine

## 2014-02-12 DIAGNOSIS — M1990 Unspecified osteoarthritis, unspecified site: Secondary | ICD-10-CM

## 2014-02-12 DIAGNOSIS — R310 Gross hematuria: Secondary | ICD-10-CM | POA: Diagnosis not present

## 2014-02-12 DIAGNOSIS — F411 Generalized anxiety disorder: Secondary | ICD-10-CM

## 2014-02-12 DIAGNOSIS — Z125 Encounter for screening for malignant neoplasm of prostate: Secondary | ICD-10-CM | POA: Diagnosis not present

## 2014-02-12 DIAGNOSIS — R339 Retention of urine, unspecified: Secondary | ICD-10-CM | POA: Diagnosis not present

## 2014-02-12 MED ORDER — ALPRAZOLAM 0.5 MG PO TABS: 0.5000 mg | ORAL_TABLET | Freq: Two times a day (BID) | ORAL | Status: DC | PRN

## 2014-02-12 MED ORDER — HYDROCODONE-ACETAMINOPHEN 5-325 MG PO TABS: 1.0000 | ORAL_TABLET | Freq: Four times a day (QID) | ORAL | Status: DC | PRN

## 2014-02-12 NOTE — Telephone Encounter (Signed)
Pt aware Rx's ready for pickup at office

## 2014-02-12 NOTE — Telephone Encounter (Signed)
Please review and advise.

## 2014-03-05 DIAGNOSIS — I700 Atherosclerosis of aorta: Secondary | ICD-10-CM | POA: Diagnosis not present

## 2014-03-05 DIAGNOSIS — R310 Gross hematuria: Secondary | ICD-10-CM | POA: Diagnosis not present

## 2014-03-05 DIAGNOSIS — N400 Enlarged prostate without lower urinary tract symptoms: Secondary | ICD-10-CM | POA: Diagnosis not present

## 2014-03-05 DIAGNOSIS — R312 Other microscopic hematuria: Secondary | ICD-10-CM | POA: Diagnosis not present

## 2014-03-16 DIAGNOSIS — R310 Gross hematuria: Secondary | ICD-10-CM | POA: Diagnosis not present

## 2014-03-16 DIAGNOSIS — R339 Retention of urine, unspecified: Secondary | ICD-10-CM | POA: Diagnosis not present

## 2014-03-20 ENCOUNTER — Telehealth: Payer: Self-pay | Admitting: Family Medicine

## 2014-03-20 ENCOUNTER — Other Ambulatory Visit: Payer: Self-pay | Admitting: Family Medicine

## 2014-03-20 DIAGNOSIS — M1990 Unspecified osteoarthritis, unspecified site: Secondary | ICD-10-CM

## 2014-03-20 MED ORDER — HYDROCODONE-ACETAMINOPHEN 5-325 MG PO TABS: 1.0000 | ORAL_TABLET | Freq: Four times a day (QID) | ORAL | Status: DC | PRN

## 2014-03-21 NOTE — Telephone Encounter (Signed)
Left message on voicemail that Rx is at office ready to be picked up

## 2014-04-28 ENCOUNTER — Other Ambulatory Visit: Payer: Self-pay | Admitting: Family Medicine

## 2014-04-28 DIAGNOSIS — M1990 Unspecified osteoarthritis, unspecified site: Secondary | ICD-10-CM

## 2014-04-28 MED ORDER — HYDROCODONE-ACETAMINOPHEN 5-325 MG PO TABS: 1.0000 | ORAL_TABLET | Freq: Four times a day (QID) | ORAL | Status: DC | PRN

## 2014-04-28 NOTE — Telephone Encounter (Signed)
Last filled 03/20/14, last seen 02/05/15. Only seen Oxford

## 2014-05-12 DIAGNOSIS — R339 Retention of urine, unspecified: Secondary | ICD-10-CM | POA: Diagnosis not present

## 2014-06-04 ENCOUNTER — Encounter (INDEPENDENT_AMBULATORY_CARE_PROVIDER_SITE_OTHER): Payer: Self-pay

## 2014-06-04 ENCOUNTER — Ambulatory Visit (INDEPENDENT_AMBULATORY_CARE_PROVIDER_SITE_OTHER): Payer: Commercial Managed Care - HMO | Admitting: Family Medicine

## 2014-06-04 ENCOUNTER — Encounter: Payer: Self-pay | Admitting: Family Medicine

## 2014-06-04 VITALS — BP 140/87 | HR 101 | Temp 97.6°F | Ht 70.0 in | Wt 176.8 lb

## 2014-06-04 DIAGNOSIS — M1990 Unspecified osteoarthritis, unspecified site: Secondary | ICD-10-CM

## 2014-06-04 DIAGNOSIS — N139 Obstructive and reflux uropathy, unspecified: Secondary | ICD-10-CM

## 2014-06-04 DIAGNOSIS — E559 Vitamin D deficiency, unspecified: Secondary | ICD-10-CM | POA: Diagnosis not present

## 2014-06-04 DIAGNOSIS — J410 Simple chronic bronchitis: Secondary | ICD-10-CM | POA: Diagnosis not present

## 2014-06-04 DIAGNOSIS — M25561 Pain in right knee: Secondary | ICD-10-CM

## 2014-06-04 LAB — POCT CBC
Granulocyte percent: 65.7 %G (ref 37–80)
HEMATOCRIT: 49.5 % (ref 43.5–53.7)
Hemoglobin: 15.1 g/dL (ref 14.1–18.1)
LYMPH, POC: 1.4 (ref 0.6–3.4)
MCH: 27.6 pg (ref 27–31.2)
MCHC: 30.5 g/dL — AB (ref 31.8–35.4)
MCV: 90.3 fL (ref 80–97)
MPV: 6 fL (ref 0–99.8)
POC Granulocyte: 3.7 (ref 2–6.9)
POC LYMPH %: 24.1 % (ref 10–50)
Platelet Count, POC: 393 10*3/uL (ref 142–424)
RBC: 5.48 M/uL (ref 4.69–6.13)
RDW, POC: 16.6 %
WBC: 5.7 10*3/uL (ref 4.6–10.2)

## 2014-06-04 MED ORDER — HYDROCODONE-ACETAMINOPHEN 5-325 MG PO TABS: 1.0000 | ORAL_TABLET | Freq: Four times a day (QID) | ORAL | Status: DC | PRN

## 2014-06-04 MED ORDER — BUDESONIDE-FORMOTEROL FUMARATE 160-4.5 MCG/ACT IN AERO: 2.0000 | INHALATION_SPRAY | Freq: Two times a day (BID) | RESPIRATORY_TRACT | Status: DC
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
## 2014-06-05 LAB — CMP14+EGFR
ALT: 18 IU/L (ref 0–44)
AST: 34 IU/L (ref 0–40)
Albumin/Globulin Ratio: 0.8 — ABNORMAL LOW (ref 1.1–2.5)
Albumin: 3.9 g/dL (ref 3.6–4.8)
Alkaline Phosphatase: 87 IU/L (ref 39–117)
BUN/Creatinine Ratio: 10 (ref 10–22)
BUN: 6 mg/dL — AB (ref 8–27)
Bilirubin Total: 0.5 mg/dL (ref 0.0–1.2)
CO2: 25 mmol/L (ref 18–29)
Calcium: 9.1 mg/dL (ref 8.6–10.2)
Chloride: 97 mmol/L (ref 97–108)
Creatinine, Ser: 0.62 mg/dL — ABNORMAL LOW (ref 0.76–1.27)
GFR calc Af Amer: 117 mL/min/{1.73_m2} (ref >59)
GFR calc non Af Amer: 101 mL/min/{1.73_m2} (ref >59)
Globulin, Total: 5.1 g/dL — ABNORMAL HIGH (ref 1.5–4.5)
Glucose: 89 mg/dL (ref 65–99)
POTASSIUM: 4.3 mmol/L (ref 3.5–5.2)
SODIUM: 138 mmol/L (ref 134–144)
Total Protein: 9 g/dL — ABNORMAL HIGH (ref 6.0–8.5)

## 2014-06-05 LAB — LIPID PANEL
Chol/HDL Ratio: 3.1 ratio units (ref 0.0–5.0)
Cholesterol, Total: 140 mg/dL (ref 100–199)
HDL: 45 mg/dL (ref >39)
LDL Calculated: 79 mg/dL (ref 0–99)
Triglycerides: 81 mg/dL (ref 0–149)
VLDL Cholesterol Cal: 16 mg/dL (ref 5–40)

## 2014-06-05 LAB — PSA, TOTAL AND FREE
PROSTATE SPECIFIC AG, SERUM: 1.5 ng/mL (ref 0.0–4.0)
PSA FREE PCT: 23.3 %
PSA FREE: 0.35 ng/mL

## 2014-06-05 LAB — VITAMIN D 25 HYDROXY (VIT D DEFICIENCY, FRACTURES): VIT D 25 HYDROXY: 12.9 ng/mL — AB (ref 30.0–100.0)

## 2014-06-08 ENCOUNTER — Other Ambulatory Visit: Payer: Self-pay | Admitting: Family Medicine

## 2014-06-08 MED ORDER — VITAMIN D (ERGOCALCIFEROL) 1.25 MG (50000 UNIT) PO CAPS: 50000.0000 [IU] | ORAL_CAPSULE | ORAL | Status: DC

## 2014-06-09 ENCOUNTER — Other Ambulatory Visit: Payer: Self-pay

## 2014-06-09 ENCOUNTER — Telehealth: Payer: Self-pay | Admitting: Family Medicine

## 2014-06-09 DIAGNOSIS — F411 Generalized anxiety disorder: Secondary | ICD-10-CM

## 2014-06-09 DIAGNOSIS — G4700 Insomnia, unspecified: Secondary | ICD-10-CM

## 2014-06-09 DIAGNOSIS — M1990 Unspecified osteoarthritis, unspecified site: Secondary | ICD-10-CM

## 2014-06-09 MED ORDER — ALPRAZOLAM 0.5 MG PO TABS: 0.5000 mg | ORAL_TABLET | Freq: Two times a day (BID) | ORAL | Status: DC | PRN

## 2014-06-09 MED ORDER — AMITRIPTYLINE HCL 50 MG PO TABS: 50.0000 mg | ORAL_TABLET | Freq: Every day | ORAL | Status: DC

## 2014-06-09 NOTE — Telephone Encounter (Signed)
Last seen 06/04/14 Dr Darlyn ReadStacks  If approved [facility]

## 2014-06-09 NOTE — Progress Notes (Signed)
Patient aware.

## 2014-06-09 NOTE — Telephone Encounter (Signed)
Please review and advise.

## 2014-06-11 NOTE — Telephone Encounter (Signed)
RXS called into [facility] Okayed per Dr Darlyn ReadStacks

## 2014-06-12 ENCOUNTER — Telehealth: Payer: Self-pay | Admitting: Family Medicine

## 2014-06-12 DIAGNOSIS — R339 Retention of urine, unspecified: Secondary | ICD-10-CM | POA: Diagnosis not present

## 2014-06-12 NOTE — Telephone Encounter (Signed)
Spoke with patient about results and suggestions.

## 2014-06-22 DIAGNOSIS — R310 Gross hematuria: Secondary | ICD-10-CM | POA: Diagnosis not present

## 2014-06-22 DIAGNOSIS — R339 Retention of urine, unspecified: Secondary | ICD-10-CM | POA: Diagnosis not present

## 2014-06-24 ENCOUNTER — Other Ambulatory Visit: Payer: Self-pay | Admitting: Urology

## 2014-07-01 NOTE — Progress Notes (Signed)
PFT 7/15 epic, lov dr Darlyn Readstacks 4/16

## 2014-07-01 NOTE — Patient Instructions (Addendum)
[facility]:  [facility]- FOLLOW SIGNS TO SHORT STAY CENTER Short Stay Center at  0800     AM.   Call this number if you have problems the morning of surgery: [phone]        Do not eat food  Or drink :After Midnight. Tuesday night   Take these medicines the morning of surgery with A SIP OF WATER: no regular medications                                  TAKE  ATROVENT INHALERS   .MAY TAKE ALPRAZOLAM, HYDROCODONE IF NEEDED, NITRPGLYCERIN/  May use ALBUTEROL if needed  Contacts, dentures or partial plates, or metal hairpins  can not be worn to surgery. Your family will be responsible for glasses, dentures, hearing aides while you are in surgery  Leave suitcase in the car. After surgery it may be brought to your room.  For patients admitted to the hospital, checkout time is 11:00 AM day of  discharge.         [facility] IS NOT RESPONSIBLE FOR ANY VALUABLES  Patients discharged the day of surgery will not be allowed to drive home. IF going home the day of surgery, you must have a driver and someone to stay with you for the first 24 hours                                                                                                                                          - Preparing for Surgery Before surgery, you can play an important role.  Because skin is not sterile, your skin needs to be as free of germs as possible.  You can reduce the number of germs on your skin by washing with CHG (chlorahexidine gluconate) soap before surgery.  CHG is an antiseptic cleaner which kills germs and bonds with the skin to continue killing germs even after washing. Please DO NOT use if you have an allergy to CHG or antibacterial soaps.  If your skin becomes reddened/irritated stop using the CHG and inform your nurse when you arrive at Short Stay. Do not shave (including legs and underarms) for at least 48 hours  prior to the first CHG shower.  You may shave your face/neck. Please follow these instructions carefully:  1.  Shower with CHG Soap the night before surgery and the  morning of Surgery.  2.  If you choose to wash your hair, wash your hair first as usual with your  normal  shampoo.  3.  After you shampoo, rinse your hair and body thoroughly to remove the  shampoo.  4.  Use CHG as you would any other liquid soap.  You can apply chg directly  to the skin and wash                       Gently with a scrungie or clean washcloth.  5.  Apply the CHG Soap to your body ONLY FROM THE NECK DOWN.   Do not use on face/ open                           Wound or open sores. Avoid contact with eyes, ears mouth and genitals (private parts).                       Wash face,  Genitals (private parts) with your normal soap.             6.  Wash thoroughly, paying special attention to the area where your surgery  will be performed.  7.  Thoroughly rinse your body with warm water from the neck down.  8.  DO NOT shower/wash with your normal soap after using and rinsing off  the CHG Soap.                9.  Pat yourself dry with a clean towel.            10.  Wear clean pajamas.            11.  Place clean sheets on your bed the night of your first shower and do not  sleep with pets. Day of Surgery : Do not apply any lotions/deodorants the morning of surgery.  Please wear clean clothes to the hospital/surgery center.  FAILURE TO FOLLOW THESE INSTRUCTIONS MAY RESULT IN THE CANCELLATION OF YOUR SURGERY PATIENT SIGNATURE_________________________________  NURSE SIGNATURE__________________________________  ________________________________________________________________________

## 2014-07-02 ENCOUNTER — Encounter (HOSPITAL_COMMUNITY): Payer: Self-pay

## 2014-07-02 ENCOUNTER — Ambulatory Visit (HOSPITAL_COMMUNITY)
Admission: RE | Admit: 2014-07-02 | Discharge: 2014-07-02 | Disposition: A | Discharge status: Home / Self Care | Payer: Commercial Managed Care - HMO | Source: Ambulatory Visit | Attending: Anesthesiology | Admitting: Anesthesiology

## 2014-07-02 ENCOUNTER — Encounter (HOSPITAL_COMMUNITY)
Admission: RE | Admit: 2014-07-02 | Discharge: 2014-07-02 | Disposition: A | Discharge status: Home / Self Care | Payer: Commercial Managed Care - HMO | Source: Ambulatory Visit | Attending: Urology | Admitting: Urology

## 2014-07-02 VITALS — BP 111/83 | HR 86 | Temp 97.7°F | Resp 18 | Ht 73.0 in | Wt 171.0 lb

## 2014-07-02 DIAGNOSIS — J449 Chronic obstructive pulmonary disease, unspecified: Secondary | ICD-10-CM

## 2014-07-02 DIAGNOSIS — Z01818 Encounter for other preprocedural examination: Secondary | ICD-10-CM | POA: Insufficient documentation

## 2014-07-02 DIAGNOSIS — F17210 Nicotine dependence, cigarettes, uncomplicated: Secondary | ICD-10-CM | POA: Diagnosis not present

## 2014-07-02 DIAGNOSIS — R634 Abnormal weight loss: Secondary | ICD-10-CM

## 2014-07-02 HISTORY — DX: Unspecified osteoarthritis, unspecified site: M19.90

## 2014-07-02 HISTORY — DX: Abnormal weight loss: R63.4

## 2014-07-02 HISTORY — DX: Anorexia: R63.0

## 2014-07-02 HISTORY — DX: Anxiety disorder, unspecified: F41.9

## 2014-07-02 HISTORY — DX: Presence of urogenital implants: Z96.0

## 2014-07-02 HISTORY — DX: Reserved for inherently not codable concepts without codable children: IMO0001

## 2014-07-02 HISTORY — DX: Presence of other specified devices: Z97.8

## 2014-07-02 LAB — CBC
HEMATOCRIT: 47 % (ref 39.0–52.0)
HEMOGLOBIN: 15.4 g/dL (ref 13.0–17.0)
MCH: 30.3 pg (ref 26.0–34.0)
MCHC: 32.8 g/dL (ref 30.0–36.0)
MCV: 92.3 fL (ref 78.0–100.0)
Platelets: 305 10*3/uL (ref 150–400)
RBC: 5.09 MIL/uL (ref 4.22–5.81)
RDW: 14.4 % (ref 11.5–15.5)
WBC: 6.8 10*3/uL (ref 4.0–10.5)

## 2014-07-02 LAB — BASIC METABOLIC PANEL
Anion gap: 7 (ref 5–15)
BUN: 9 mg/dL (ref 6–20)
CALCIUM: 9.3 mg/dL (ref 8.9–10.3)
CO2: 28 mmol/L (ref 22–32)
Chloride: 100 mmol/L — ABNORMAL LOW (ref 101–111)
Creatinine, Ser: 0.76 mg/dL (ref 0.61–1.24)
GFR calc Af Amer: >60 mL/min (ref >60)
GFR calc non Af Amer: >60 mL/min (ref >60)
GLUCOSE: 95 mg/dL (ref 65–99)
Potassium: 4.4 mmol/L (ref 3.5–5.1)
Sodium: 135 mmol/L (ref 135–145)

## 2014-07-07 MED ORDER — GENTAMICIN SULFATE 40 MG/ML IJ SOLN
5.0000 mg/kg | INTRAMUSCULAR | Status: AC
Start: 2014-07-08 — End: 2014-07-08
  Administered 2014-07-08: 390 mg via INTRAVENOUS
  Filled 2014-07-07: qty 9.75

## 2014-07-08 ENCOUNTER — Observation Stay (HOSPITAL_COMMUNITY)
Admission: RE | Admit: 2014-07-08 | Discharge: 2014-07-09 | Disposition: A | Discharge status: Home / Self Care | Payer: Commercial Managed Care - HMO | Source: Ambulatory Visit | Attending: Urology | Admitting: Urology

## 2014-07-08 ENCOUNTER — Ambulatory Visit (HOSPITAL_COMMUNITY): Payer: Commercial Managed Care - HMO | Admitting: Anesthesiology

## 2014-07-08 ENCOUNTER — Encounter (HOSPITAL_COMMUNITY)
Admission: RE | Disposition: A | Discharge status: Home / Self Care | Payer: Self-pay | Source: Ambulatory Visit | Attending: Urology

## 2014-07-08 ENCOUNTER — Encounter (HOSPITAL_COMMUNITY): Payer: Self-pay | Admitting: *Deleted

## 2014-07-08 DIAGNOSIS — N400 Enlarged prostate without lower urinary tract symptoms: Secondary | ICD-10-CM | POA: Diagnosis present

## 2014-07-08 DIAGNOSIS — J449 Chronic obstructive pulmonary disease, unspecified: Secondary | ICD-10-CM | POA: Insufficient documentation

## 2014-07-08 DIAGNOSIS — Z23 Encounter for immunization: Secondary | ICD-10-CM | POA: Diagnosis not present

## 2014-07-08 DIAGNOSIS — Z79899 Other long term (current) drug therapy: Secondary | ICD-10-CM | POA: Diagnosis not present

## 2014-07-08 DIAGNOSIS — F419 Anxiety disorder, unspecified: Secondary | ICD-10-CM | POA: Diagnosis not present

## 2014-07-08 DIAGNOSIS — M1990 Unspecified osteoarthritis, unspecified site: Secondary | ICD-10-CM | POA: Insufficient documentation

## 2014-07-08 DIAGNOSIS — R338 Other retention of urine: Secondary | ICD-10-CM | POA: Insufficient documentation

## 2014-07-08 DIAGNOSIS — Z96651 Presence of right artificial knee joint: Secondary | ICD-10-CM | POA: Insufficient documentation

## 2014-07-08 DIAGNOSIS — N401 Enlarged prostate with lower urinary tract symptoms: Principal | ICD-10-CM | POA: Insufficient documentation

## 2014-07-08 DIAGNOSIS — Z87891 Personal history of nicotine dependence: Secondary | ICD-10-CM | POA: Diagnosis not present

## 2014-07-08 DIAGNOSIS — Z886 Allergy status to analgesic agent status: Secondary | ICD-10-CM | POA: Diagnosis not present

## 2014-07-08 HISTORY — PX: TRANSURETHRAL RESECTION OF PROSTATE: SHX73

## 2014-07-08 LAB — HEMOGLOBIN AND HEMATOCRIT, BLOOD
HEMATOCRIT: 40.3 % (ref 39.0–52.0)
Hemoglobin: 12.9 g/dL — ABNORMAL LOW (ref 13.0–17.0)

## 2014-07-08 SURGERY — TRANSURETHRAL RESECTION OF THE PROSTATE WITH GYRUS INSTRUMENTS
Anesthesia: General

## 2014-07-08 MED ORDER — CIPROFLOXACIN HCL 500 MG PO TABS
500.0000 mg | ORAL_TABLET | Freq: Two times a day (BID) | ORAL | Status: AC
  Administered 2014-07-08: 500 mg via ORAL
  Filled 2014-07-08: qty 1

## 2014-07-08 MED ORDER — FENTANYL CITRATE (PF) 250 MCG/5ML IJ SOLN
INTRAMUSCULAR | Status: AC
  Filled 2014-07-08: qty 5

## 2014-07-08 MED ORDER — PHENYLEPHRINE HCL 10 MG/ML IJ SOLN
10.0000 mg | INTRAVENOUS | Status: DC | PRN
  Administered 2014-07-08: 25 ug/min via INTRAVENOUS

## 2014-07-08 MED ORDER — HYDROMORPHONE HCL 1 MG/ML IJ SOLN
INTRAMUSCULAR | Status: AC
  Filled 2014-07-08: qty 2

## 2014-07-08 MED ORDER — DOCUSATE SODIUM 100 MG PO CAPS
100.0000 mg | ORAL_CAPSULE | Freq: Two times a day (BID) | ORAL | Status: DC
  Administered 2014-07-08 – 2014-07-09 (×2): 100 mg via ORAL
  Filled 2014-07-08 (×2): qty 1

## 2014-07-08 MED ORDER — AMITRIPTYLINE HCL 50 MG PO TABS
50.0000 mg | ORAL_TABLET | Freq: Every day | ORAL | Status: DC
  Administered 2014-07-08: 50 mg via ORAL
  Filled 2014-07-08: qty 1

## 2014-07-08 MED ORDER — PROPOFOL 10 MG/ML IV BOLUS
INTRAVENOUS | Status: AC
  Filled 2014-07-08: qty 20

## 2014-07-08 MED ORDER — DIPHENHYDRAMINE HCL 50 MG/ML IJ SOLN: 12.5000 mg | Freq: Four times a day (QID) | INTRAMUSCULAR | Status: DC | PRN

## 2014-07-08 MED ORDER — TAMSULOSIN HCL 0.4 MG PO CAPS
0.4000 mg | ORAL_CAPSULE | Freq: Every day | ORAL | Status: DC
  Administered 2014-07-08 – 2014-07-09 (×2): 0.4 mg via ORAL
  Filled 2014-07-08 (×2): qty 1

## 2014-07-08 MED ORDER — SODIUM CHLORIDE 0.9 % IV SOLN
INTRAVENOUS | Status: DC
  Administered 2014-07-08 – 2014-07-09 (×2): via INTRAVENOUS

## 2014-07-08 MED ORDER — LACTATED RINGERS IV SOLN
INTRAVENOUS | Status: DC
  Administered 2014-07-08: 1000 mL via INTRAVENOUS
  Administered 2014-07-08: 10:00:00 via INTRAVENOUS

## 2014-07-08 MED ORDER — PROPOFOL 10 MG/ML IV BOLUS
INTRAVENOUS | Status: DC | PRN
  Administered 2014-07-08: 160 mg via INTRAVENOUS
  Administered 2014-07-08: 40 mg via INTRAVENOUS

## 2014-07-08 MED ORDER — SODIUM CHLORIDE 0.9 % IR SOLN
Status: DC | PRN
  Administered 2014-07-08: 21000 mL

## 2014-07-08 MED ORDER — ONDANSETRON HCL 4 MG/2ML IJ SOLN: 4.0000 mg | INTRAMUSCULAR | Status: DC | PRN

## 2014-07-08 MED ORDER — BUDESONIDE-FORMOTEROL FUMARATE 160-4.5 MCG/ACT IN AERO
2.0000 | INHALATION_SPRAY | Freq: Two times a day (BID) | RESPIRATORY_TRACT | Status: DC
  Administered 2014-07-08 – 2014-07-09 (×2): 2 via RESPIRATORY_TRACT
  Filled 2014-07-08: qty 6

## 2014-07-08 MED ORDER — PHENYLEPHRINE HCL 10 MG/ML IJ SOLN
INTRAMUSCULAR | Status: DC | PRN
  Administered 2014-07-08: 80 ug via INTRAVENOUS
  Administered 2014-07-08: 40 ug via INTRAVENOUS
  Administered 2014-07-08: 120 ug via INTRAVENOUS
  Administered 2014-07-08: 40 ug via INTRAVENOUS

## 2014-07-08 MED ORDER — DEXAMETHASONE SODIUM PHOSPHATE 10 MG/ML IJ SOLN
INTRAMUSCULAR | Status: DC | PRN
  Administered 2014-07-08: 10 mg via INTRAVENOUS

## 2014-07-08 MED ORDER — DIPHENHYDRAMINE HCL 12.5 MG/5ML PO ELIX: 12.5000 mg | ORAL_SOLUTION | Freq: Four times a day (QID) | ORAL | Status: DC | PRN

## 2014-07-08 MED ORDER — MORPHINE SULFATE 2 MG/ML IJ SOLN: 2.0000 mg | INTRAMUSCULAR | Status: DC | PRN

## 2014-07-08 MED ORDER — HYDROCODONE-ACETAMINOPHEN 5-325 MG PO TABS: 1.0000 | ORAL_TABLET | ORAL | Status: DC | PRN

## 2014-07-08 MED ORDER — SODIUM CHLORIDE 0.9 % IR SOLN: 3000.0000 mL | Status: DC

## 2014-07-08 MED ORDER — PROMETHAZINE HCL 25 MG/ML IJ SOLN: 6.2500 mg | INTRAMUSCULAR | Status: DC | PRN

## 2014-07-08 MED ORDER — FENTANYL CITRATE (PF) 100 MCG/2ML IJ SOLN
INTRAMUSCULAR | Status: DC | PRN
Start: 2014-07-08 — End: 2014-07-08
  Administered 2014-07-08 (×4): 50 ug via INTRAVENOUS

## 2014-07-08 MED ORDER — HYDROMORPHONE HCL 1 MG/ML IJ SOLN
0.2500 mg | INTRAMUSCULAR | Status: DC | PRN
  Administered 2014-07-08 (×4): 0.5 mg via INTRAVENOUS

## 2014-07-08 MED ORDER — ALBUTEROL SULFATE (2.5 MG/3ML) 0.083% IN NEBU: 2.5000 mg | INHALATION_SOLUTION | Freq: Four times a day (QID) | RESPIRATORY_TRACT | Status: DC | PRN

## 2014-07-08 MED ORDER — MIDAZOLAM HCL 2 MG/2ML IJ SOLN
INTRAMUSCULAR | Status: AC
  Filled 2014-07-08: qty 2

## 2014-07-08 MED ORDER — MIDAZOLAM HCL 5 MG/5ML IJ SOLN
INTRAMUSCULAR | Status: DC | PRN
  Administered 2014-07-08: .5 mg via INTRAVENOUS

## 2014-07-08 MED ORDER — ALBUTEROL SULFATE (2.5 MG/3ML) 0.083% IN NEBU: 3.0000 mL | INHALATION_SOLUTION | Freq: Four times a day (QID) | RESPIRATORY_TRACT | Status: DC | PRN

## 2014-07-08 MED ORDER — ONDANSETRON HCL 4 MG/2ML IJ SOLN
INTRAMUSCULAR | Status: DC | PRN
  Administered 2014-07-08: 4 mg via INTRAVENOUS

## 2014-07-08 MED ORDER — OXYBUTYNIN CHLORIDE ER 5 MG PO TB24
5.0000 mg | ORAL_TABLET | Freq: Every day | ORAL | Status: DC
  Administered 2014-07-08: 5 mg via ORAL
  Filled 2014-07-08: qty 1

## 2014-07-08 MED ORDER — PNEUMOCOCCAL VAC POLYVALENT 25 MCG/0.5ML IJ INJ
0.5000 mL | INJECTION | INTRAMUSCULAR | Status: AC
  Administered 2014-07-09: 0.5 mL via INTRAMUSCULAR
  Filled 2014-07-08 (×2): qty 0.5

## 2014-07-08 MED ORDER — ALPRAZOLAM 0.5 MG PO TABS: 0.5000 mg | ORAL_TABLET | Freq: Two times a day (BID) | ORAL | Status: DC | PRN

## 2014-07-08 MED ORDER — LIDOCAINE HCL (CARDIAC) 20 MG/ML IV SOLN
INTRAVENOUS | Status: DC | PRN
  Administered 2014-07-08: 80 mg via INTRAVENOUS

## 2014-07-08 SURGICAL SUPPLY — 18 items
BAG URINE DRAINAGE (UROLOGICAL SUPPLIES) ×3 IMPLANT
BAG URO CATCHER STRL LF (DRAPE) ×3 IMPLANT
CATH FOLEY 3WAY 30CC 22FR (CATHETERS) ×3 IMPLANT
CATH HEMA 3WAY 30CC 24FR COUDE (CATHETERS) ×3 IMPLANT
DRAPE CAMERA CLOSED 9X96 (DRAPES) ×3 IMPLANT
GLOVE BIOGEL M STRL SZ7.5 (GLOVE) ×3 IMPLANT
GOWN STRL REUS W/TWL LRG LVL3 (GOWN DISPOSABLE) ×6 IMPLANT
HOLDER FOLEY CATH W/STRAP (MISCELLANEOUS) ×3 IMPLANT
IV NS IRRIG 3000ML ARTHROMATIC (IV SOLUTION) IMPLANT
KIT ASPIRATION TUBING (SET/KITS/TRAYS/PACK) IMPLANT
LOOP CUT BIPOLAR 24F LRG (ELECTROSURGICAL) ×3 IMPLANT
MANIFOLD NEPTUNE II (INSTRUMENTS) ×3 IMPLANT
PACK CYSTO (CUSTOM PROCEDURE TRAY) ×3 IMPLANT
PLUG CATH AND CAP STER (CATHETERS) ×3 IMPLANT
SYR 30ML LL (SYRINGE) ×3 IMPLANT
SYRINGE IRR TOOMEY STRL 70CC (SYRINGE) ×3 IMPLANT
TUBING CONNECTING 10 (TUBING) ×2 IMPLANT
TUBING CONNECTING 10' (TUBING) ×1
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
## 2014-07-08 NOTE — Brief Op Note (Signed)
07/08/2014  11:01 AM  PATIENT:  [name] Rollins  69 y.o. male  PRE-OPERATIVE DIAGNOSIS:  URINARY RETENTION FROM BENIGN PROSTATIC HYPERPLASIA  POST-OPERATIVE DIAGNOSIS:  URINARY RETENTION FROM BENIGN PROSTATIC HYPERPLASIA  PROCEDURE:  Procedure(s): TRANSURETHRAL RESECTION OF THE PROSTATE  (N/A)  SURGEON:  Surgeon(s) and Role:    * Sebastian Acheheodore Tavarion Babington, MD - Primary  PHYSICIAN ASSISTANT:   ASSISTANTS: Loyola MastWilliam Kirby MD   ANESTHESIA:   general  EBL:  Total I/O In: 1000 [I.V.:1000] Out: -   BLOOD ADMINISTERED:none  DRAINS: 24 F 3 way foley to NS irrigation   LOCAL MEDICATIONS USED:  NONE  SPECIMEN:  Source of Specimen:  prostate chips  DISPOSITION OF SPECIMEN:  PATHOLOGY  COUNTS:  YES  TOURNIQUET:  * No tourniquets in log *  DICTATION: .Other Dictation: Dictation Number [id number]  PLAN OF CARE: Admit for overnight observation  PATIENT DISPOSITION:  PACU - hemodynamically stable.   Delay start of Pharmacological VTE agent (>24hrs) due to surgical blood loss or risk of bleeding: yes

## 2014-07-08 NOTE — Anesthesia Procedure Notes (Signed)
Procedure Name: LMA Insertion Date/Time: 07/08/2014 9:49 AM Performed by: Edison PaceGRAY, Nhat Hearne E Pre-anesthesia Checklist: Patient identified, Emergency Drugs available, Suction available, Patient being monitored and Timeout performed Patient Re-evaluated:Patient Re-evaluated prior to inductionOxygen Delivery Method: Circle system utilized Preoxygenation: Pre-oxygenation with 100% oxygen Intubation Type: IV induction LMA: LMA inserted LMA Size: 4.0 Number of attempts: 1 Tube secured with: Tape Dental Injury: Teeth and Oropharynx as per pre-operative assessment

## 2014-07-08 NOTE — Anesthesia Preprocedure Evaluation (Addendum)
Anesthesia Evaluation  Patient identified by MRN, date of birth, ID band Patient awake    Reviewed: Allergy & Precautions, NPO status , Patient's Chart, lab work & pertinent test results  History of Anesthesia Complications Negative for: history of anesthetic complications  Airway Mallampati: I  TM Distance: >3 FB Neck ROM: Full    Dental  (+) Edentulous Upper, Edentulous Lower, Dental Advisory Given   Pulmonary COPD COPD inhaler, Current Smoker,    Pulmonary exam normal       Cardiovascular negative cardio ROS Normal cardiovascular exam    Neuro/Psych PSYCHIATRIC DISORDERS Anxiety negative neurological ROS     GI/Hepatic negative GI ROS, Neg liver ROS,   Endo/Other  negative endocrine ROS  Renal/GU negative Renal ROS     Musculoskeletal   Abdominal   Peds  Hematology   Anesthesia Other Findings   Reproductive/Obstetrics                            Anesthesia Physical Anesthesia Plan  ASA: III  Anesthesia Plan: General   Post-op Pain Management:    Induction: Intravenous  Airway Management Planned: LMA  Additional Equipment:   Intra-op Plan:   Post-operative Plan: Extubation in OR  Informed Consent: I have reviewed the patients History and Physical, chart, labs and discussed the procedure including the risks, benefits and alternatives for the proposed anesthesia with the patient or authorized representative who has indicated his/her understanding and acceptance.   Dental advisory given  Plan Discussed with: CRNA, Anesthesiologist and Surgeon  Anesthesia Plan Comments:        Anesthesia Quick Evaluation

## 2014-07-08 NOTE — Anesthesia Postprocedure Evaluation (Signed)
Anesthesia Post Note  Patient: [name] Rollins  Procedure(s) Performed: Procedure(s) (LRB): TRANSURETHRAL RESECTION OF THE PROSTATE  (N/A)  Anesthesia type: general  Patient location: PACU  Post pain: Pain level controlled  Post assessment: Patient's Cardiovascular Status Stable  Last Vitals:  Filed Vitals:   07/08/14 1220  BP: 101/61  Pulse: 73  Temp: 36.5 C  Resp: 16    Post vital signs: Reviewed and stable  Level of consciousness: sedated  Complications: No apparent anesthesia complications

## 2014-07-08 NOTE — Transfer of Care (Signed)
Immediate Anesthesia Transfer of Care Note  Patient: [name] Rollins  Procedure(s) Performed: Procedure(s): TRANSURETHRAL RESECTION OF THE PROSTATE  (N/A)  Patient Location: PACU  Anesthesia Type:General  Level of Consciousness: awake, oriented, patient cooperative, lethargic and responds to stimulation  Airway & Oxygen Therapy: Patient Spontanous Breathing and Patient connected to face mask oxygen  Post-op Assessment: Report given to RN, Post -op Vital signs reviewed and stable and Patient moving all extremities  Post vital signs: Reviewed and stable  Last Vitals:  Filed Vitals:   07/08/14 0708  BP: 143/77  Pulse: 95  Temp: 36.3 C  Resp: 20    Complications: No apparent anesthesia complications

## 2014-07-08 NOTE — Care Management Note (Signed)
Case Management Note  Patient Details  Name: [name] Rollins MRN: [id number] Date of Birth: Nov 14, 1945  Subjective/Objective: 69 y/o m admitted w/BPH.s/p TURP.From home.                   Action/Plan:d/c plan home.   Expected Discharge Date:                  Expected Discharge Plan:  Home/Self Care  In-House Referral:     Discharge planning Services  CM Consult  Post Acute Care Choice:    Choice offered to:     DME Arranged:    DME Agency:     HH Arranged:    HH Agency:     Status of Service:  In process, will continue to follow  Medicare Important Message Given:    Date Medicare IM Given:    Medicare IM give by:    Date Additional Medicare IM Given:    Additional Medicare Important Message give by:     If discussed at Long Length of Stay Meetings, dates discussed:    Additional Comments:  Lanier ClamMahabir, [name] Cohick, RN 07/08/2014, 12:59 PM

## 2014-07-09 ENCOUNTER — Encounter (HOSPITAL_COMMUNITY): Payer: Self-pay | Admitting: Urology

## 2014-07-09 DIAGNOSIS — I10 Essential (primary) hypertension: Secondary | ICD-10-CM | POA: Diagnosis not present

## 2014-07-09 DIAGNOSIS — Z79899 Other long term (current) drug therapy: Secondary | ICD-10-CM | POA: Diagnosis not present

## 2014-07-09 DIAGNOSIS — M1990 Unspecified osteoarthritis, unspecified site: Secondary | ICD-10-CM | POA: Diagnosis not present

## 2014-07-09 DIAGNOSIS — N401 Enlarged prostate with lower urinary tract symptoms: Secondary | ICD-10-CM | POA: Diagnosis not present

## 2014-07-09 DIAGNOSIS — Z886 Allergy status to analgesic agent status: Secondary | ICD-10-CM | POA: Diagnosis not present

## 2014-07-09 DIAGNOSIS — Z87891 Personal history of nicotine dependence: Secondary | ICD-10-CM | POA: Diagnosis not present

## 2014-07-09 DIAGNOSIS — R338 Other retention of urine: Secondary | ICD-10-CM | POA: Diagnosis not present

## 2014-07-09 DIAGNOSIS — Z96651 Presence of right artificial knee joint: Secondary | ICD-10-CM | POA: Diagnosis not present

## 2014-07-09 DIAGNOSIS — F419 Anxiety disorder, unspecified: Secondary | ICD-10-CM | POA: Diagnosis not present

## 2014-07-09 DIAGNOSIS — J449 Chronic obstructive pulmonary disease, unspecified: Secondary | ICD-10-CM | POA: Diagnosis not present

## 2014-07-09 LAB — HEMOGLOBIN AND HEMATOCRIT, BLOOD
HEMATOCRIT: 43.3 % (ref 39.0–52.0)
Hemoglobin: 13.9 g/dL (ref 13.0–17.0)

## 2014-07-09 LAB — BASIC METABOLIC PANEL
ANION GAP: 7 (ref 5–15)
BUN: 11 mg/dL (ref 6–20)
CALCIUM: 8.7 mg/dL — AB (ref 8.9–10.3)
CO2: 27 mmol/L (ref 22–32)
Chloride: 105 mmol/L (ref 101–111)
Creatinine, Ser: 0.66 mg/dL (ref 0.61–1.24)
GFR calc Af Amer: >60 mL/min (ref >60)
GLUCOSE: 111 mg/dL — AB (ref 65–99)
POTASSIUM: 4.2 mmol/L (ref 3.5–5.1)
SODIUM: 139 mmol/L (ref 135–145)

## 2014-07-09 MED ORDER — HYDROCODONE-ACETAMINOPHEN 5-325 MG PO TABS: 1.0000 | ORAL_TABLET | ORAL | Status: DC | PRN

## 2014-07-09 MED ORDER — CIPROFLOXACIN HCL 500 MG PO TABS: 500.0000 mg | ORAL_TABLET | Freq: Two times a day (BID) | ORAL | Status: DC

## 2014-07-09 NOTE — Discharge Summary (Signed)
Date of admission: 07/08/2014  Date of discharge: 07/09/2014  Admission diagnosis: BPH  Discharge diagnosis: BPH  History and Physical: For full details, please see admission history and physical. Briefly, [name] Rollins is a 69 y.o. year old patient with history of BPH admitted following TURP.   Hospital Course: Patient taken for TURP on 07/08/14 which he tolerated well.  Post-operatively, he recovered initially with CBI which was weaned by POD#1.  He was tolerating regular diet, pain controlled with PO pain medicine and ambulating.  He was taught how to manage foley catheter at home and deemed fit for discharge.  Laboratory values:  Recent Labs  07/08/14 1200 07/09/14 0452  HGB 12.9* 13.9  HCT 40.3 43.3    Recent Labs  07/09/14 0452  CREATININE 0.66    PE: Lying in bed in NAD Belly soft Breathing easily Regular rate Foley in place with light pink drainage with BPI disconnected.    Disposition: Home  Discharge medications:    Medication List    TAKE these medications        albuterol 108 (90 BASE) MCG/ACT inhaler  Commonly known as:  PROVENTIL HFA;VENTOLIN HFA  Inhale 2 puffs into the lungs every 6 (six) hours as needed for wheezing or shortness of breath.     albuterol (2.5 MG/3ML) 0.083% nebulizer solution  Commonly known as:  PROVENTIL  Take 3 mLs (2.5 mg total) by nebulization every 6 (six) hours as needed for wheezing or shortness of breath.     ALPRAZolam 0.5 MG tablet  Commonly known as:  XANAX  Take 1 tablet (0.5 mg total) by mouth 2 (two) times daily as needed for anxiety.     amitriptyline 50 MG tablet  Commonly known as:  ELAVIL  Take 1 tablet (50 mg total) by mouth at bedtime.     budesonide-formoterol 160-4.5 MCG/ACT inhaler  Commonly known as:  SYMBICORT  Inhale 2 puffs into the lungs 2 (two) times daily.     ciprofloxacin 500 MG tablet  Commonly known as:  CIPRO  Take 1 tablet (500 mg total) by mouth 2 (two) times daily.     HYDROcodone-acetaminophen 5-325 MG per tablet  Commonly known as:  NORCO  Take 1-2 tablets by mouth every 6 (six) hours as needed for moderate pain.     HYDROcodone-acetaminophen 5-325 MG per tablet  Commonly known as:  NORCO/VICODIN  Take 1-2 tablets by mouth every 4 (four) hours as needed for moderate pain.     ipratropium 0.02 % nebulizer solution  Commonly known as:  ATROVENT  Take 2.5 mLs (0.5 mg total) by nebulization 4 (four) times daily.     oxybutynin 5 MG 24 hr tablet  Commonly known as:  DITROPAN-XL  Take 5 mg by mouth at bedtime.     sertraline 50 MG tablet  Commonly known as:  ZOLOFT  Take 1 tablet (50 mg total) by mouth daily.     tamsulosin 0.4 MG Caps capsule  Commonly known as:  FLOMAX  Take 1 capsule (0.4 mg total) by mouth daily.     Vitamin D (Ergocalciferol) 50000 UNITS Caps capsule  Commonly known as:  DRISDOL  Take 1 capsule (50,000 Units total) by mouth 2 (two) times a week.        Followup:      Follow-up Information    Follow up with Sebastian Ache, MD On 07/14/2014.   Specialty:  Urology   Why:  at 2pm for MD visit and office catheter removal  Contact information:   [address] [place] [address] [phone]      I have discussed plan with Dr. Craige CottaKirby in detail and agree pt amenable to discharge as per above. My collegue Dr. Coy Saunasahlsdtet also discussed with Dr. Craige CottaKirby.

## 2014-07-09 NOTE — Discharge Instructions (Signed)
1. You may see some blood in the urine and may have some burning with urination for 48-72 hours. You also may notice that you have to urinate more frequently or urgently after your procedure which is normal.  2. You should call should you develop an inability urinate, fever > 101, persistent nausea and vomiting that prevents you from eating or drinking to stay hydrated.  3. You were discharged with a catheter. You will be taught how to take care of the catheter by the nursing staff prior to discharge from the hospital.  You may periodically feel a strong urge to void with the catheter in place.  This is a bladder spasm and most often can occur when having a bowel movement or moving around. It is typically self-limited and usually will stop after a few minutes.  You may use some Vaseline or Neosporin around the tip of the catheter to reduce friction at the tip of the penis. You may also see some blood in the urine.  A very small amount of blood can make the urine look quite red.  As long as the catheter is draining well, there usually is not a problem.  However, if the catheter is not draining well and is bloody, you should call the office [phone]) to notify us.  Take antibiotics as prescribed until you followup with Dr. Berneice HeinrichManny next week  You will follow up with Dr. Berneice HeinrichManny on Tuesday 6/7 for removal of foley catheter

## 2014-07-09 NOTE — Care Management Note (Signed)
Case Management Note  Patient Details  Name: Lynnea FerrierJames Proudfoot MRN: [id number] Date of Birth: 1945-09-06  Subjective/Objective:                    Action/Plan:d/c home no needs or orders.   Expected Discharge Date:                  Expected Discharge Plan:  Home/Self Care  In-House Referral:     Discharge planning Services  CM Consult  Post Acute Care Choice:    Choice offered to:     DME Arranged:    DME Agency:     HH Arranged:    HH Agency:     Status of Service:  Completed, signed off  Medicare Important Message Given:    Date Medicare IM Given:    Medicare IM give by:    Date Additional Medicare IM Given:    Additional Medicare Important Message give by:     If discussed at Long Length of Stay Meetings, dates discussed:    Additional Comments:  Lanier ClamMahabir, Bryanna Yim, RN 07/09/2014, 11:51 AM

## 2014-07-09 NOTE — Progress Notes (Signed)
Completed D/C teaching with patient and answered all questions. Pt states he understands how to use leg bag and demonstrated placement. Pt will be transported to home via [facility].  Letitia NeriBeeson, Ezri Fanguy C

## 2014-07-09 NOTE — Op Note (Signed)
NAME:  [name] HawthorneLONDON, [name]                ACCOUNT NO.:  [id number]  MEDICAL RECORD NO.:  [id number]  LOCATION:  1401                         [facility]  PHYSICIAN:  Sebastian Acheheodore Janiylah Hannis, MD     DATE OF BIRTH:  03-08-1945  DATE OF PROCEDURE:  07/08/2014                              OPERATIVE REPORT  PREOPERATIVE DIAGNOSIS:  Prostatic hypertrophy with refractory urinary retention.  PROCEDURE:  Transurethral resection of prostate.  ESTIMATED BLOOD LOSS:  100 mL.  COMPLICATIONS:  None.  SPECIMEN:  Prostate chips for permanent pathology.  FINDINGS: 1. Bilobar prostatic hypertrophy pre-resection. 2. Excellent wide open channel from the bladder neck to the area of     the membranous urethra post resection.  CATHETER:  A 24-French 3-way Hematuria catheter to normal saline irrigation. Efflux light pink.  ASSISTANT:  Loyola MastWilliam Kirby, M.D.  INDICATION:  Mr. [name] HitchLondon is a pleasant 69 year old gentleman with history of urinary retention.  This has been refractory to maximal medical therapy with 5 alpha reductase inhibitors and alpha blockers.  He underwent urodynamics  which corroborated a high-pressure, low-flow bladder and cystoscopy which revealed bilobar prostatic hypertrophy is the likely source of his obstruction.  Options were discussed with the patient including chronic indwelling catheters versus self catheterization versus outlet procedure with transurethral resection of the prostate and he wished to proceed with the latter.  Informed consent was obtained and placed in medical record.  PROCEDURE IN DETAIL:  The patient being [name] FerrierJames Rollins verified, procedure being transurethral resection of prostate was confirmed.  Procedure was carried out.  Time-out was performed.  Intravenous antibiotics were administered.  General LMA anesthesia was introduced.  The patient was placed into a low lithotomy position.  Sterile field was created by prepping and draping the patient's penis, perineum,  and proximal thighs using iodine x3.  Next, cystourethroscopy was performed using a 26- French resectoscope sheath with visual obturator.  Inspection of the anterior urethra was unremarkable.  Inspection of the posterior urethra revealed significant bilobar prostatic hypertrophy from the prostatic apex to the area of the bladder neck as expected.  There is no significant median lobe and bladder was trabeculated with catheter exchange as expected, this was irrigated several times.  Next, prostate resection was performed in a top-down approach, first establishing a good channel at the 12 o'clock position from the bladder neck to the area of the verumontanum followed by resection of the right lobe of the prostate in top-down fashion, taking the resection down what appeared to be the circular fibers of the prostatic capsule followed by resection of the left lobe in a similar fashion.  Great care was taken to avoid any undermining the bladder neck and this did not occur.  Additional careful apical dissection was performed, taking down any residual obstructing apical tissue.  Prostate chips were flushed, set aside for permanent pathology.  Additional coagulation current was applied to the prostatic fossa.  Following these maneuvers, hemostasis appeared excellent.  There was again a wide open channel with empty bladder from the bladder neck to the membranous urethra.  The resectoscope was exchanged for a new 24-French 3-way Hematuria coude catheter with 30 mL of sterile water in the balloon.  This was connected to normal saline irrigation, with the efflux very light pink.  Procedure was terminated.  The patient tolerated the procedure well.  There were no immediate periprocedural complications.  The patient was taken to the postanesthesia care unit in stable condition.          ______________________________ Sebastian Ache, MD     TM/MEDQ  D:  07/08/2014  T:  07/09/2014  Job:  [id number]

## 2014-07-09 NOTE — Progress Notes (Signed)
[facility] called for transport.     Loredana Medellin, LCSW [facility] Clinical Social Worker cell #: [phone]  

## 2014-07-09 NOTE — Progress Notes (Signed)
CSW consulted for transportation needs. Patient will need non-emergency ambulance transport home. CSW confirmed home address with patient.   [facility] will be called for transport when ready.   No other CSW needs identified - CSW signing off.   Lincoln MaxinKelly Waylynn Benefiel, LCSW [facility] Clinical Social Worker cell #: [phone]

## 2014-07-13 ENCOUNTER — Telehealth: Payer: Self-pay | Admitting: Family Medicine

## 2014-07-13 DIAGNOSIS — M1990 Unspecified osteoarthritis, unspecified site: Secondary | ICD-10-CM

## 2014-07-13 MED ORDER — HYDROCODONE-ACETAMINOPHEN 5-325 MG PO TABS: 1.0000 | ORAL_TABLET | Freq: Four times a day (QID) | ORAL | Status: DC | PRN

## 2014-07-13 NOTE — Telephone Encounter (Signed)
Normally, he would need to be seen, but since he had his surgery last week, I will be glad to help this time. Scrip renewed, printed.

## 2014-07-14 ENCOUNTER — Other Ambulatory Visit: Payer: Self-pay | Admitting: Family Medicine

## 2014-07-14 NOTE — Telephone Encounter (Signed)
Patient aware that rx is ready to be picked up.  

## 2014-07-24 IMAGING — CR DG KNEE 1-2V*L*
3 series · 3 of 3 positions shown · non-contrast
Comparison: None.

CLINICAL DATA: Arthropathy

EXAM:
LEFT KNEE - 1-2 VIEW ; RIGHT [REDACTED]VIEW

[view not recorded (1 of 3)]
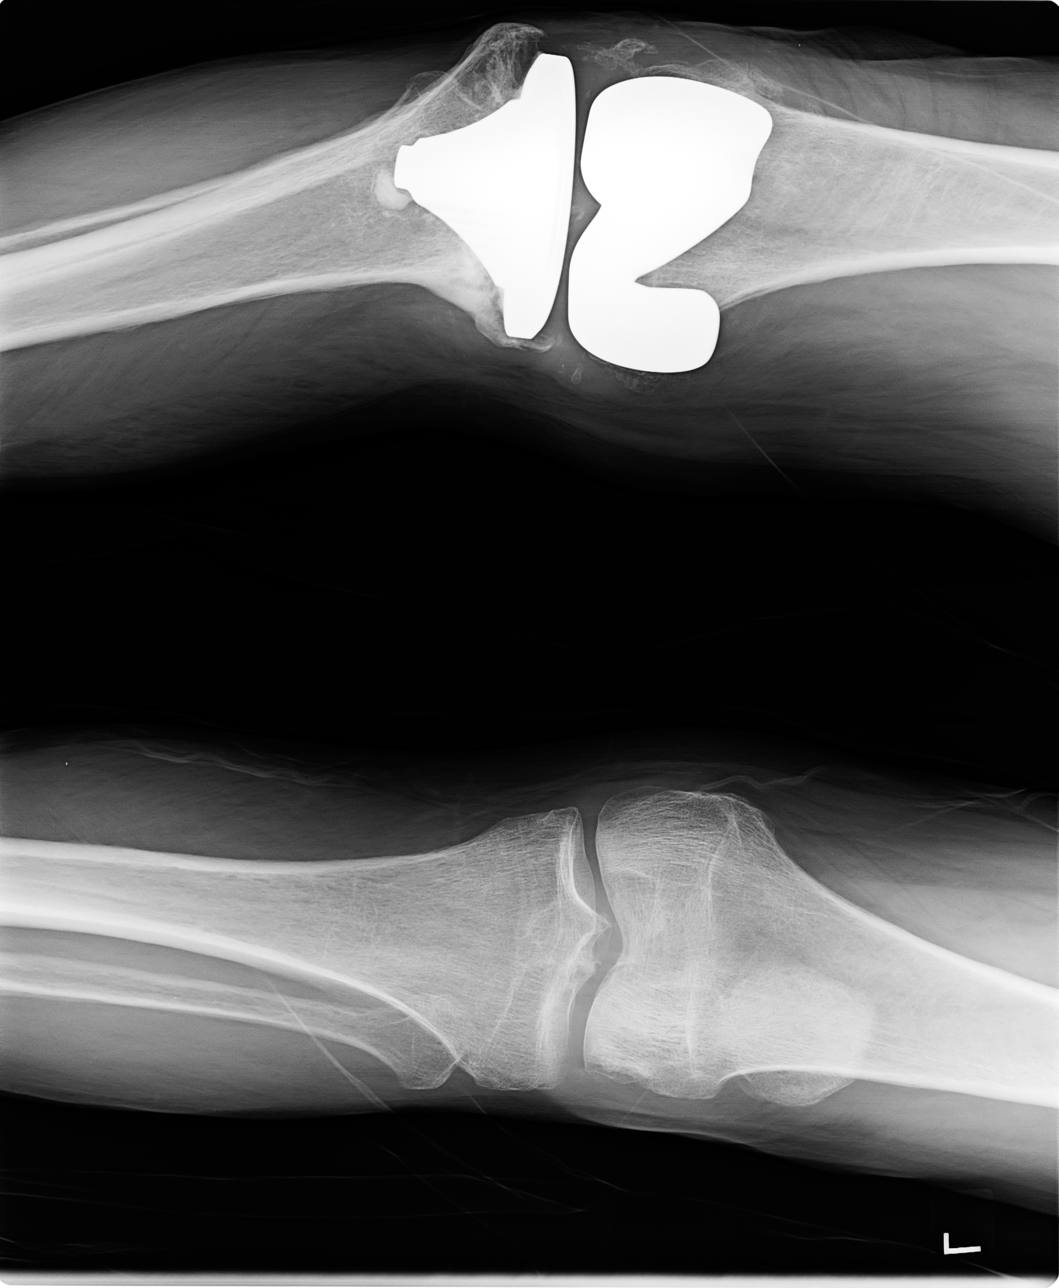

[view not recorded (2 of 3)]
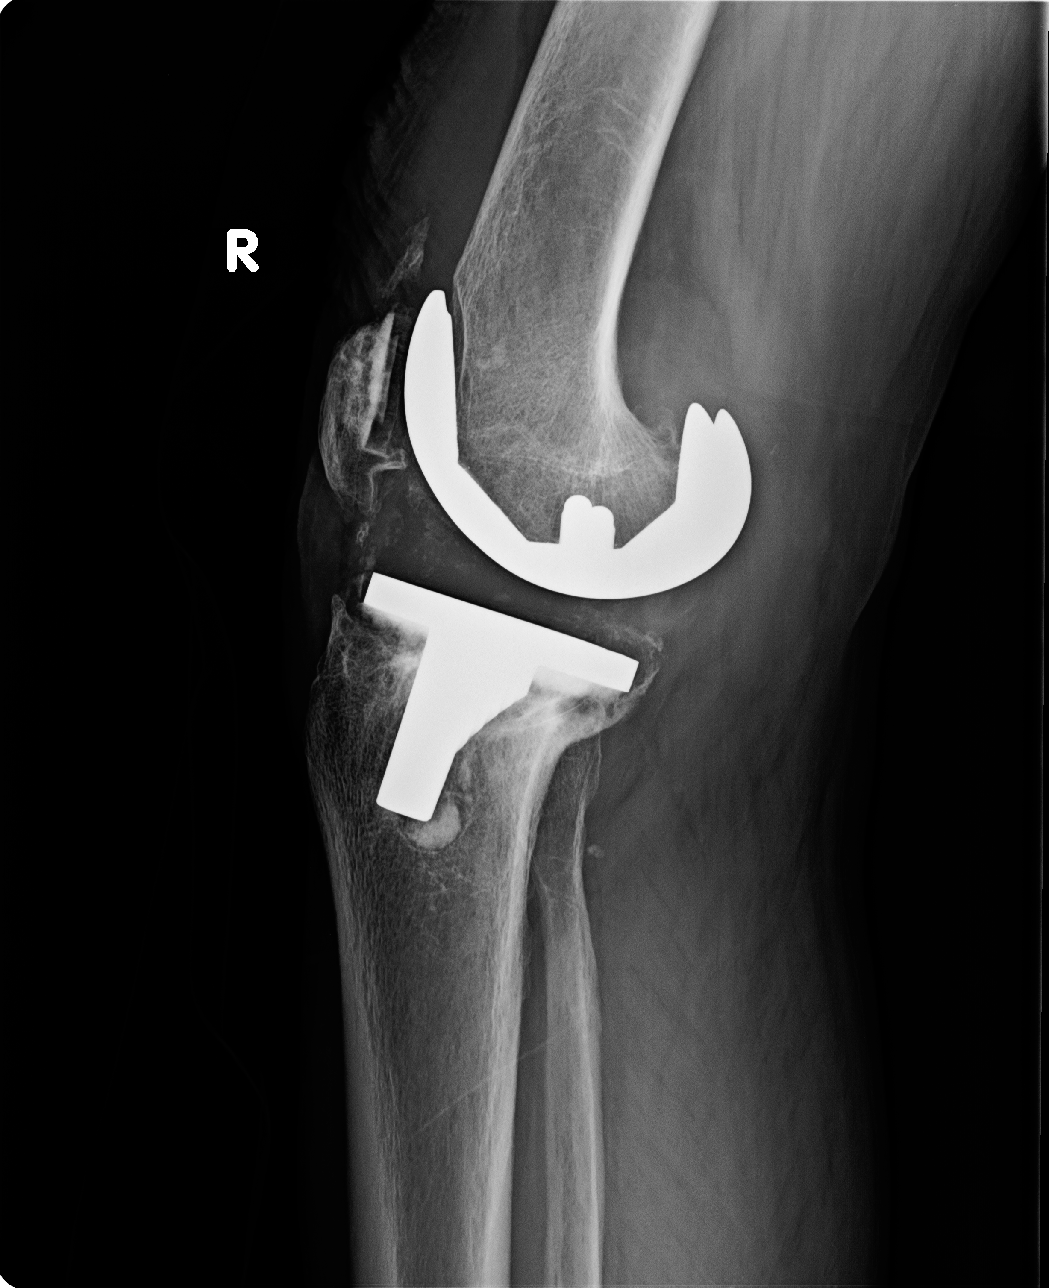

[view not recorded (3 of 3)]
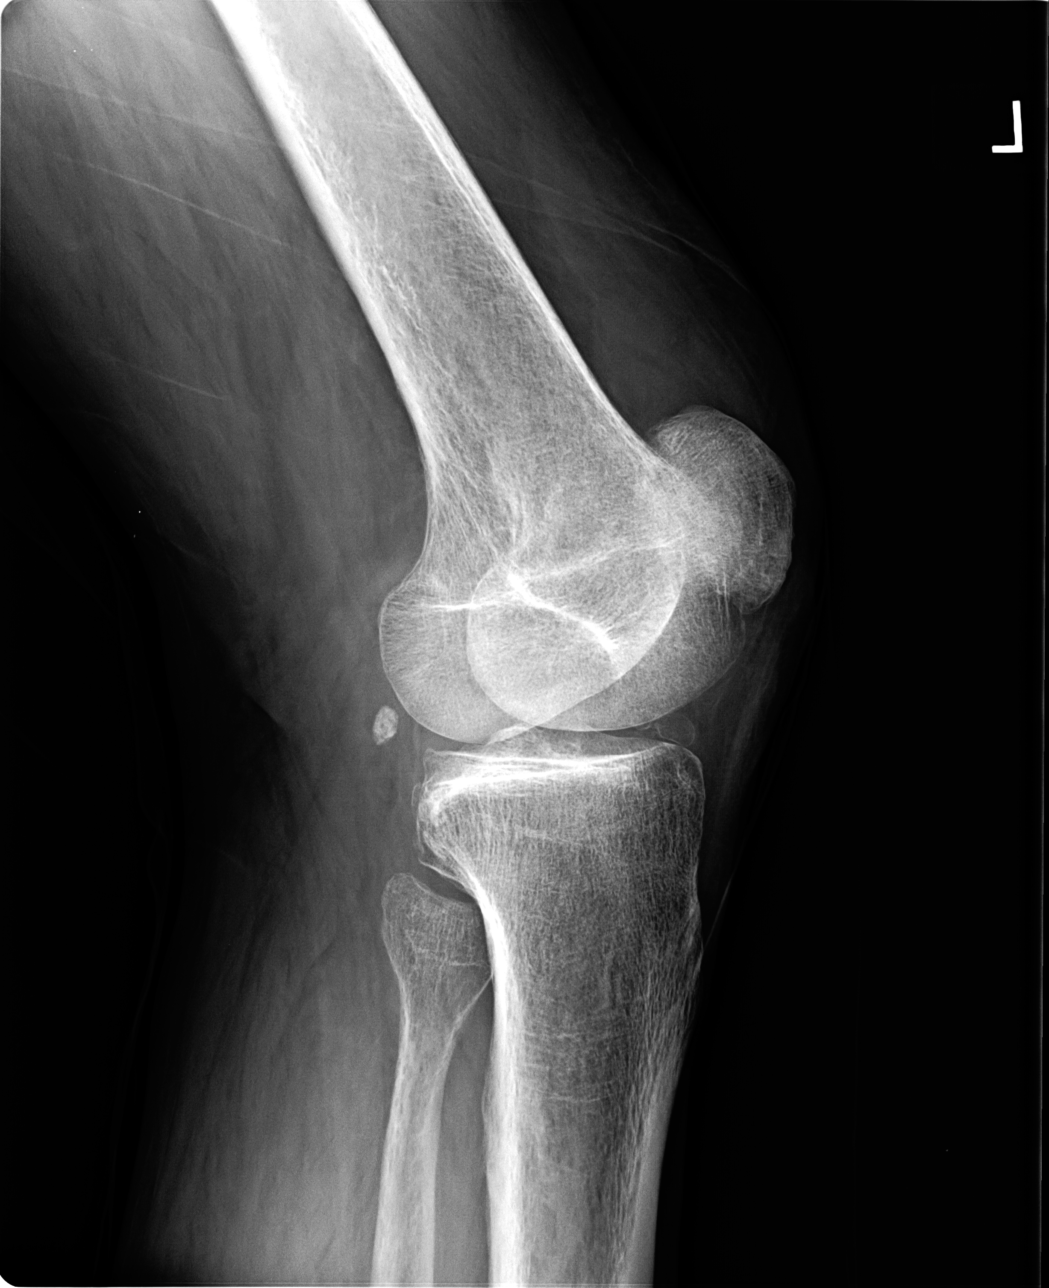

[3 of 3 positions shown; findings below may reference images not displayed]

FINDINGS: RIGHT KNEE: Frontal and lateral views were obtained. There is a
total knee prosthesis present. There is varus angulation along the
medial aspect of the proximal tibia with what appears to be old
trauma in this area. There is no acute fracture or dislocation. No
effusion. There is calcification in the suprapatellar bursa.

There is some lucency between the bone of the distal anterior
femoral metaphysis and the prosthesis. Similar lucency is present
antral laterally in the tibial region.

LEFT KNEE: Frontal and lateral views were obtained. There is a small
joint effusion. No fracture or dislocation. There is moderate
narrowing of the patellofemoral joint. No erosive change. Other
joint spaces appear intact. Bones are osteoporotic.
IMPRESSION: Right knee: Postoperative change. Varus angulation proximal tibia,
probably residua of old trauma. Question prosthetic component
loosening anteriorly involving the femoral component and antral
laterally involving the tibial component. No acute fracture.

Left knee: Arthropathy in the patellofemoral joint. Small joint
effusion. No fracture or erosive change.

Bones appear somewhat osteoporotic diffusely.

## 2014-08-13 ENCOUNTER — Other Ambulatory Visit: Payer: Self-pay | Admitting: Family Medicine

## 2014-08-14 ENCOUNTER — Other Ambulatory Visit: Payer: Self-pay

## 2014-08-14 DIAGNOSIS — J410 Simple chronic bronchitis: Secondary | ICD-10-CM

## 2014-08-14 DIAGNOSIS — M1990 Unspecified osteoarthritis, unspecified site: Secondary | ICD-10-CM

## 2014-08-14 DIAGNOSIS — G4700 Insomnia, unspecified: Secondary | ICD-10-CM

## 2014-08-14 DIAGNOSIS — F411 Generalized anxiety disorder: Secondary | ICD-10-CM

## 2014-08-14 MED ORDER — ALPRAZOLAM 0.5 MG PO TABS: 0.5000 mg | ORAL_TABLET | Freq: Two times a day (BID) | ORAL | Status: DC | PRN

## 2014-08-14 MED ORDER — AMITRIPTYLINE HCL 50 MG PO TABS: 50.0000 mg | ORAL_TABLET | Freq: Every day | ORAL | Status: DC

## 2014-08-14 MED ORDER — ALBUTEROL SULFATE HFA 108 (90 BASE) MCG/ACT IN AERS: 2.0000 | INHALATION_SPRAY | Freq: Four times a day (QID) | RESPIRATORY_TRACT | Status: DC | PRN

## 2014-08-14 NOTE — Telephone Encounter (Signed)
Appointment scheduled for medication refills.

## 2014-08-14 NOTE — Telephone Encounter (Signed)
Needs appt

## 2014-08-14 NOTE — Telephone Encounter (Signed)
Last seen 06/04/14 Dr Darlyn ReadStacks  If approved print

## 2014-08-17 ENCOUNTER — Ambulatory Visit: Payer: Self-pay | Admitting: Family Medicine

## 2014-08-20 ENCOUNTER — Ambulatory Visit (INDEPENDENT_AMBULATORY_CARE_PROVIDER_SITE_OTHER): Payer: Commercial Managed Care - HMO | Admitting: Family Medicine

## 2014-08-20 VITALS — Temp 96.5°F | Ht 70.0 in | Wt 172.6 lb

## 2014-08-20 DIAGNOSIS — F411 Generalized anxiety disorder: Secondary | ICD-10-CM | POA: Diagnosis not present

## 2014-08-20 DIAGNOSIS — M25561 Pain in right knee: Secondary | ICD-10-CM | POA: Diagnosis not present

## 2014-08-20 DIAGNOSIS — J410 Simple chronic bronchitis: Secondary | ICD-10-CM | POA: Diagnosis not present

## 2014-08-20 MED ORDER — BUDESONIDE-FORMOTEROL FUMARATE 160-4.5 MCG/ACT IN AERO: 2.0000 | INHALATION_SPRAY | Freq: Two times a day (BID) | RESPIRATORY_TRACT | Status: DC

## 2014-08-20 MED ORDER — ALPRAZOLAM 0.5 MG PO TABS
0.5000 mg | ORAL_TABLET | Freq: Two times a day (BID) | ORAL | Status: DC | PRN
Start: 2014-08-20 — End: 2014-10-19

## 2014-08-20 NOTE — Progress Notes (Signed)
Subjective:  Patient ID: [name] Rollins, male    DOB: 1945/10/01  Age: 69 y.o. 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 He has been very nervous having a sense that things are going badly. There is an impending doom sensation. He has not felt tearful or depressed. He is not irritable. Multiple work and home factors have heightened his stress. Additionally the anxiety seems to make his shortness of breath from his chronic bronchitis worse.  History Alann has a past medical history of Weight loss, unintentional (07/02/14); Anorexia; Arthritis; Anxiety; Foley catheter in place; COPD (chronic obstructive pulmonary disease); and Shortness of breath dyspnea.   He has past surgical history that includes Replacement total knee; Joint replacement; Eye surgery (Bilateral); Fracture surgery; and Transurethral resection of prostate (N/A, 07/08/2014).   His family history includes Cancer in his father; Diabetes in his brother and brother.He reports that he has been smoking Cigarettes.  He has a 25 pack-year smoking history. He has never used smokeless tobacco. He reports that he drinks alcohol. He reports that he does not use illicit drugs.  Outpatient Prescriptions Prior to Visit  Medication Sig Dispense Refill  . albuterol (PROVENTIL HFA;VENTOLIN HFA) 108 (90 BASE) MCG/ACT inhaler Inhale 2 puffs into the lungs every 6 (six) hours as needed for wheezing or shortness of breath. 1 Inhaler 2  . albuterol (PROVENTIL) (2.5 MG/3ML) 0.083% nebulizer solution Take 3 mLs (2.5 mg total) by nebulization every 6 (six) hours as needed for wheezing or shortness of breath. 150 mL 3  . amitriptyline (ELAVIL) 50 MG tablet Take 1 tablet (50 mg total) by mouth at bedtime. 30 tablet 0  . HYDROcodone-acetaminophen (NORCO) 5-325 MG per tablet Take 1-2 tablets by mouth every 6 (six) hours as needed for moderate pain. 60 tablet 0  . ipratropium (ATROVENT) 0.02 %  nebulizer solution Take 2.5 mLs (0.5 mg total) by nebulization 4 (four) times daily. 75 mL 12  . oxybutynin (DITROPAN-XL) 5 MG 24 hr tablet Take 5 mg by mouth at bedtime.    . sertraline (ZOLOFT) 50 MG tablet Take 1 tablet (50 mg total) by mouth daily. 30 tablet 11  . tamsulosin (FLOMAX) 0.4 MG CAPS capsule Take 1 capsule (0.4 mg total) by mouth daily. 30 capsule 3  . ALPRAZolam (XANAX) 0.5 MG tablet Take 1 tablet (0.5 mg total) by mouth 2 (two) times daily as needed for anxiety. 60 tablet 0  . budesonide-formoterol (SYMBICORT) 160-4.5 MCG/ACT inhaler Inhale 2 puffs into the lungs 2 (two) times daily. 1 Inhaler 3  . ciprofloxacin (CIPRO) 500 MG tablet Take 1 tablet (500 mg total) by mouth 2 (two) times daily. (Patient not taking: Reported on 08/20/2014) 12 tablet 0  . Vitamin D, Ergocalciferol, (DRISDOL) 50000 UNITS CAPS capsule Take 1 capsule (50,000 Units total) by mouth 2 (two) times a week. (Patient not taking: Reported on 08/20/2014) 16 capsule 0   No facility-administered medications prior to visit.    ROS Review of Systems  Constitutional: Negative for fever, chills and diaphoresis.  HENT: Negative for congestion, rhinorrhea and sore throat.   Respiratory: Negative for cough, shortness of breath and wheezing.   Cardiovascular: Negative for chest pain.  Gastrointestinal: Negative for nausea, vomiting, abdominal pain, diarrhea, constipation and abdominal distention.  Genitourinary: Negative for dysuria and frequency.  Musculoskeletal: Positive for arthralgias (right knee). Negative for joint swelling. Gait problem: right knee.  Skin: Negative for rash.  Neurological: Negative  for headaches.    Objective:  Temp(Src) 96.5 F (35.8 C) (Oral)  Ht 5\' 10"  (1.778 m)  Wt 172 lb 9.6 oz (78.291 kg)  BMI 24.77 kg/m2  BP Readings from Last 3 Encounters:  07/09/14 102/52  07/02/14 111/83  06/04/14 140/87    Wt Readings from Last 3 Encounters:  08/20/14 172 lb 9.6 oz (78.291 kg)    07/08/14 171 lb (77.565 kg)  07/02/14 171 lb (77.565 kg)     Physical Exam  Constitutional: He appears well-developed and well-nourished.  HENT:  Head: Normocephalic and atraumatic.  Right Ear: Tympanic membrane and external ear normal. No decreased hearing is noted.  Left Ear: Tympanic membrane and external ear normal. No decreased hearing is noted.  Mouth/Throat: No oropharyngeal exudate or posterior oropharyngeal erythema.  Eyes: Pupils are equal, round, and reactive to light.  Neck: Normal range of motion. Neck supple.  Cardiovascular: Normal rate and regular rhythm.   No murmur heard. Pulmonary/Chest: Breath sounds normal. No respiratory distress.  Abdominal: Soft. Bowel sounds are normal. He exhibits no mass. There is no tenderness.  Psychiatric: He has a normal mood and affect. His behavior is normal. Judgment and thought content normal.  Vitals reviewed.   No results found for: HGBA1C  Lab Results  Component Value Date   WBC 6.8 07/02/2014   HGB 13.9 07/09/2014   HCT 43.3 07/09/2014   PLT 305 07/02/2014   GLUCOSE 111* 07/09/2014   CHOL 140 06/04/2014   TRIG 81 06/04/2014   HDL 45 06/04/2014   LDLCALC 79 06/04/2014   ALT 18 06/04/2014   AST 34 06/04/2014   NA 139 07/09/2014   K 4.2 07/09/2014   CL 105 07/09/2014   CREATININE 0.66 07/09/2014   BUN 11 07/09/2014   CO2 27 07/09/2014   TSH 2.990 12/09/2012   PSA 1.5 06/04/2014    Dg Chest 2 View  07/02/2014   CLINICAL DATA:  Preop prostate resection.  Smoker.  EXAM: CHEST  2 VIEW  COMPARISON:  06/20/2013  FINDINGS: There is hyperinflation of the lungs compatible with COPD. Scarring in the lung bases. Heart is normal size. No acute airspace opacities or effusions. No acute bony abnormality.  IMPRESSION: COPD/chronic changes.  No active disease.   Electronically Signed   By: Charlett NoseKevin  Dover M.D.   On: 07/02/2014 17:02    Assessment & Plan:   Fayrene FearingJames was seen today for pain and gad.  Diagnoses and all orders for  this visit:  Anxiety state Orders: -     ALPRAZolam (XANAX) 0.5 MG tablet; Take 1 tablet (0.5 mg total) by mouth 2 (two) times daily as needed for anxiety.  Simple chronic bronchitis Orders: -     budesonide-formoterol (SYMBICORT) 160-4.5 MCG/ACT inhaler; Inhale 2 puffs into the lungs 2 (two) times daily.  Right knee pain Orders: -     Ambulatory referral to Orthopedic Surgery   I have discontinued Mr. Tyrone NineLondon's Vitamin D (Ergocalciferol) and ciprofloxacin. I am also having him maintain his ipratropium, sertraline, albuterol, tamsulosin, oxybutynin, HYDROcodone-acetaminophen, amitriptyline, albuterol, ALPRAZolam, and budesonide-formoterol.  Meds ordered this encounter  Medications  . ALPRAZolam (XANAX) 0.5 MG tablet    Sig: Take 1 tablet (0.5 mg total) by mouth 2 (two) times daily as needed for anxiety.    Dispense:  60 tablet    Refill:  0  . budesonide-formoterol (SYMBICORT) 160-4.5 MCG/ACT inhaler    Sig: Inhale 2 puffs into the lungs 2 (two) times daily.    Dispense:  1 Inhaler  Refill:  3     Follow-up: Return in about 3 months (around 11/20/2014), or if symptoms worsen or fail to improve.  Mechele Claude, M.D.

## 2014-08-22 DIAGNOSIS — H5213 Myopia, bilateral: Secondary | ICD-10-CM | POA: Diagnosis not present

## 2014-08-22 DIAGNOSIS — H5203 Hypermetropia, bilateral: Secondary | ICD-10-CM | POA: Diagnosis not present

## 2014-08-22 DIAGNOSIS — H5210 Myopia, unspecified eye: Secondary | ICD-10-CM | POA: Diagnosis not present

## 2014-08-31 ENCOUNTER — Other Ambulatory Visit: Payer: Self-pay | Admitting: Family Medicine

## 2014-09-01 NOTE — Telephone Encounter (Signed)
Lm,Our records show there are refills on Symbicort.

## 2014-10-15 ENCOUNTER — Telehealth: Payer: Self-pay | Admitting: Family Medicine

## 2014-10-15 DIAGNOSIS — M1990 Unspecified osteoarthritis, unspecified site: Secondary | ICD-10-CM

## 2014-10-15 DIAGNOSIS — G4700 Insomnia, unspecified: Secondary | ICD-10-CM

## 2014-10-15 DIAGNOSIS — F411 Generalized anxiety disorder: Secondary | ICD-10-CM

## 2014-10-15 NOTE — Telephone Encounter (Signed)
That REQUIRES an office visit

## 2014-10-15 NOTE — Telephone Encounter (Signed)
Patient aware he needs an appt. Will call to schedule when he finds transportation.

## 2014-10-19 ENCOUNTER — Ambulatory Visit (INDEPENDENT_AMBULATORY_CARE_PROVIDER_SITE_OTHER): Payer: Commercial Managed Care - HMO | Admitting: Family Medicine

## 2014-10-19 ENCOUNTER — Encounter: Payer: Self-pay | Admitting: Family Medicine

## 2014-10-19 VITALS — BP 121/72 | HR 86 | Temp 97.8°F | Ht 70.0 in | Wt 173.4 lb

## 2014-10-19 DIAGNOSIS — J410 Simple chronic bronchitis: Secondary | ICD-10-CM | POA: Diagnosis not present

## 2014-10-19 DIAGNOSIS — M1990 Unspecified osteoarthritis, unspecified site: Secondary | ICD-10-CM | POA: Diagnosis not present

## 2014-10-19 DIAGNOSIS — G4700 Insomnia, unspecified: Secondary | ICD-10-CM | POA: Diagnosis not present

## 2014-10-19 DIAGNOSIS — F411 Generalized anxiety disorder: Secondary | ICD-10-CM | POA: Diagnosis not present

## 2014-10-19 DIAGNOSIS — E785 Hyperlipidemia, unspecified: Secondary | ICD-10-CM | POA: Insufficient documentation

## 2014-10-19 MED ORDER — CELECOXIB 200 MG PO CAPS: 200.0000 mg | ORAL_CAPSULE | Freq: Every day | ORAL | Status: DC

## 2014-10-19 MED ORDER — ALPRAZOLAM 0.5 MG PO TABS: 0.2500 mg | ORAL_TABLET | Freq: Two times a day (BID) | ORAL | Status: DC | PRN

## 2014-10-19 MED ORDER — BUDESONIDE-FORMOTEROL FUMARATE 160-4.5 MCG/ACT IN AERO: 2.0000 | INHALATION_SPRAY | Freq: Two times a day (BID) | RESPIRATORY_TRACT | Status: DC

## 2014-10-19 MED ORDER — AMITRIPTYLINE HCL 50 MG PO TABS: 50.0000 mg | ORAL_TABLET | Freq: Every day | ORAL | Status: DC

## 2014-10-19 NOTE — Progress Notes (Signed)
Subjective:  Patient ID: [name] Rollins, male    DOB: [birth year]  Age: 69 y.o. MRN: [id number]  CC: GAD; COPD; and Pain   HPI ILAI HILLER III presents for multiple circumstances including inability to drive and loss of his wife and daughter have led to a great deal of anxiety. He states that he just sits and thinks about the bad things in the past. He does not have a sudden attack as panic. He is just nervous and jittery. He is not tearful or sad. However he sits awake some nights just regretting and reminiscing. He felt a pop about 3 years ago when he was standing up from a kneeling position at the right knee where he had previously had a knee replacement. Pain is now 6/10. He hasn't had any hydrocodone for couple of weeks. He missed his appointment with Dr.Alusio of orthopedics on August 25 he states it was because the message on the phone encounter was unclear. He says that his knee turns inward now and it's painful to walk on.  He is also following up for COPD and says that is very stable with his new Symbicort inhaler. He doesn't have to use the albuterol very often and the Symbicort works much better anyway. He is short of breath for extended ambulation further than going to the mailbox. Patient in for follow-up of elevated cholesterol. Doing well without complaints on current medication. Denies side effects of statin including myalgia and arthralgia and nausea. Also in today for liver function testing. Currently no chest pain, shortness of breath or other cardiovascular related symptoms noted.   History Mattson has a past medical history of Weight loss, unintentional (07/02/14); Anorexia; Arthritis; Anxiety; Foley catheter in place; COPD (chronic obstructive pulmonary disease); and Shortness of breath dyspnea.   He has past surgical history that includes Replacement total knee; Joint replacement; Eye surgery (Bilateral); Fracture surgery; and Transurethral resection of prostate (N/A,  07/08/2014).   His family history includes Cancer in his father; Diabetes in his brother and brother.He reports that he quit smoking 7 days ago. His smoking use included Cigarettes. He has a 25 pack-year smoking history. He has never used smokeless tobacco. He reports that he drinks alcohol. He reports that he does not use illicit drugs.  Outpatient Prescriptions Prior to Visit  Medication Sig Dispense Refill  . albuterol (PROVENTIL HFA;VENTOLIN HFA) 108 (90 BASE) MCG/ACT inhaler Inhale 2 puffs into the lungs every 6 (six) hours as needed for wheezing or shortness of breath. 1 Inhaler 2  . albuterol (PROVENTIL) (2.5 MG/3ML) 0.083% nebulizer solution Take 3 mLs (2.5 mg total) by nebulization every 6 (six) hours as needed for wheezing or shortness of breath. 150 mL 3  . ipratropium (ATROVENT) 0.02 % nebulizer solution Take 2.5 mLs (0.5 mg total) by nebulization 4 (four) times daily. 75 mL 12  . oxybutynin (DITROPAN-XL) 5 MG 24 hr tablet Take 5 mg by mouth at bedtime.    . sertraline (ZOLOFT) 50 MG tablet Take 1 tablet (50 mg total) by mouth daily. 30 tablet 11  . tamsulosin (FLOMAX) 0.4 MG CAPS capsule Take 1 capsule (0.4 mg total) by mouth daily. 30 capsule 3  . ALPRAZolam (XANAX) 0.5 MG tablet Take 1 tablet (0.5 mg total) by mouth 2 (two) times daily as needed for anxiety. 60 tablet 0  . amitriptyline (ELAVIL) 50 MG tablet Take 1 tablet (50 mg total) by mouth at bedtime. 30 tablet 0  . budesonide-formoterol (SYMBICORT) 160-4.5 MCG/ACT inhaler  Inhale 2 puffs into the lungs 2 (two) times daily. 1 Inhaler 3  . HYDROcodone-acetaminophen (NORCO) 5-325 MG per tablet Take 1-2 tablets by mouth every 6 (six) hours as needed for moderate pain. 60 tablet 0   No facility-administered medications prior to visit.    ROS Review of Systems  Constitutional: Negative for fever, chills and diaphoresis.  HENT: Negative for congestion, rhinorrhea and sore throat.   Respiratory: Negative for cough and wheezing.     Cardiovascular: Negative for chest pain.  Gastrointestinal: Negative for nausea, vomiting, abdominal pain and diarrhea.  Genitourinary: Negative for dysuria and frequency.  Musculoskeletal: Positive for arthralgias. Negative for joint swelling.  Skin: Negative for rash.  Neurological: Negative for headaches.  Psychiatric/Behavioral: Positive for sleep disturbance. Negative for hallucinations, behavioral problems and agitation. The patient is nervous/anxious.     Objective:  BP 121/72 mmHg  Pulse 86  Temp(Src) 97.8 F (36.6 C) (Oral)  Ht $R'5\' 10"'NP$  (1.778 m)  Wt 173 lb 6.4 oz (78.654 kg)  BMI 24.88 kg/m2  BP Readings from Last 3 Encounters:  10/19/14 121/72  07/09/14 102/52  07/02/14 111/83    Wt Readings from Last 3 Encounters:  10/19/14 173 lb 6.4 oz (78.654 kg)  08/20/14 172 lb 9.6 oz (78.291 kg)  07/08/14 171 lb (77.565 kg)     Physical Exam  Constitutional: He appears well-developed and well-nourished.  HENT:  Head: Normocephalic and atraumatic.  Right Ear: Tympanic membrane and external ear normal. No decreased hearing is noted.  Left Ear: Tympanic membrane and external ear normal. No decreased hearing is noted.  Mouth/Throat: No oropharyngeal exudate or posterior oropharyngeal erythema.  Eyes: Pupils are equal, round, and reactive to light.  Neck: Normal range of motion. Neck supple.  Cardiovascular: Normal rate and regular rhythm.   No murmur heard. Pulmonary/Chest: Breath sounds normal. No respiratory distress.  Abdominal: Soft. Bowel sounds are normal. He exhibits no mass. There is no tenderness.  Musculoskeletal: He exhibits tenderness (for ROM at right knee. Varus deformity noted.. Knee replacement scar noted as well.).  Vitals reviewed.   No results found for: HGBA1C  Lab Results  Component Value Date   WBC 6.8 07/02/2014   HGB 13.9 07/09/2014   HCT 43.3 07/09/2014   PLT 305 07/02/2014   GLUCOSE 111* 07/09/2014   CHOL 140 06/04/2014   TRIG 81  06/04/2014   HDL 45 06/04/2014   LDLCALC 79 06/04/2014   ALT 18 06/04/2014   AST 34 06/04/2014   NA 139 07/09/2014   K 4.2 07/09/2014   CL 105 07/09/2014   CREATININE 0.66 07/09/2014   BUN 11 07/09/2014   CO2 27 07/09/2014   TSH 2.990 12/09/2012   PSA 1.5 06/04/2014    Dg Chest 2 View  07/02/2014   CLINICAL DATA:  Preop prostate resection.  Smoker.  EXAM: CHEST  2 VIEW  COMPARISON:  06/20/2013  FINDINGS: There is hyperinflation of the lungs compatible with COPD. Scarring in the lung bases. Heart is normal size. No acute airspace opacities or effusions. No acute bony abnormality.  IMPRESSION: COPD/chronic changes.  No active disease.   Electronically Signed   By: Rolm Baptise M.D.   On: 07/02/2014 17:02    Assessment & Plan:   Eean was seen today for gad, copd and pain.  Diagnoses and all orders for this visit:  Insomnia -     amitriptyline (ELAVIL) 50 MG tablet; Take 1 tablet (50 mg total) by mouth at bedtime. -  CBC with Differential/Platelet -     CMP14+EGFR  Arthritis -     amitriptyline (ELAVIL) 50 MG tablet; Take 1 tablet (50 mg total) by mouth at bedtime. -     CBC with Differential/Platelet -     CMP14+EGFR  Anxiety state -     ALPRAZolam (XANAX) 0.5 MG tablet; Take 0.5 tablets (0.25 mg total) by mouth 2 (two) times daily as needed for anxiety. -     CBC with Differential/Platelet -     CMP14+EGFR  Simple chronic bronchitis -     budesonide-formoterol (SYMBICORT) 160-4.5 MCG/ACT inhaler; Inhale 2 puffs into the lungs 2 (two) times daily. -     CBC with Differential/Platelet -     CMP14+EGFR  Hyperlipidemia -     Lipid panel  Other orders -     Cancel: HYDROcodone-acetaminophen (NORCO) 5-325 MG per tablet; Take 1-2 tablets by mouth every 6 (six) hours as needed for moderate pain. -     Discontinue: celecoxib (CELEBREX) 200 MG capsule; Take 1 capsule (200 mg total) by mouth daily. With food -     celecoxib (CELEBREX) 200 MG capsule; Take 1 capsule (200 mg  total) by mouth daily. With food for pain   I have discontinued Mr. Bolar HYDROcodone-acetaminophen. I have also changed his ALPRAZolam and celecoxib. Additionally, I am having him maintain his ipratropium, sertraline, albuterol, tamsulosin, oxybutynin, albuterol, amitriptyline, and budesonide-formoterol.  Meds ordered this encounter  Medications  . amitriptyline (ELAVIL) 50 MG tablet    Sig: Take 1 tablet (50 mg total) by mouth at bedtime.    Dispense:  30 tablet    Refill:  3  . ALPRAZolam (XANAX) 0.5 MG tablet    Sig: Take 0.5 tablets (0.25 mg total) by mouth 2 (two) times daily as needed for anxiety.    Dispense:  30 tablet    Refill:  2  . budesonide-formoterol (SYMBICORT) 160-4.5 MCG/ACT inhaler    Sig: Inhale 2 puffs into the lungs 2 (two) times daily.    Dispense:  1 Inhaler    Refill:  3  . DISCONTD: celecoxib (CELEBREX) 200 MG capsule    Sig: Take 1 capsule (200 mg total) by mouth daily. With food    Dispense:  30 capsule    Refill:  5  . celecoxib (CELEBREX) 200 MG capsule    Sig: Take 1 capsule (200 mg total) by mouth daily. With food for pain    Dispense:  30 capsule    Refill:  5   We discussed the dangers of continuing on alprazolam and he is agreeable to reducing his dose. Additionally he has been out of the hydrocodone for 2 weeks and his pain has been tolerable. He is willing to try the Celebrex instead. I explained to him that without significant information regarding his pain such as an MRI or orthopedic statement I could not justify using opiates for pain control. He will reschedule his orthopedic appointment.  Follow-up: Return in about 3 months (around 01/18/2015) for Arthritis, Pain, anxiety, COPD.  Claretta Fraise, M.D.

## 2015-01-05 ENCOUNTER — Telehealth: Payer: Self-pay | Admitting: *Deleted

## 2015-01-05 NOTE — Telephone Encounter (Signed)
Patient needs flu shot and FOBT

## 2015-02-22 ENCOUNTER — Telehealth: Payer: Self-pay | Admitting: Family Medicine

## 2015-03-31 ENCOUNTER — Other Ambulatory Visit: Payer: Self-pay | Admitting: Family Medicine

## 2015-03-31 NOTE — Telephone Encounter (Signed)
Last seen 10/19/14  Dr Darlyn Read  IF approved route to nurse to call into [facility]

## 2015-04-02 ENCOUNTER — Other Ambulatory Visit: Payer: Self-pay | Admitting: Family Medicine

## 2015-04-02 DIAGNOSIS — M1990 Unspecified osteoarthritis, unspecified site: Secondary | ICD-10-CM

## 2015-04-02 DIAGNOSIS — J410 Simple chronic bronchitis: Secondary | ICD-10-CM

## 2015-04-02 DIAGNOSIS — G4700 Insomnia, unspecified: Secondary | ICD-10-CM

## 2015-04-02 MED ORDER — BUDESONIDE-FORMOTEROL FUMARATE 160-4.5 MCG/ACT IN AERO: 2.0000 | INHALATION_SPRAY | Freq: Two times a day (BID) | RESPIRATORY_TRACT | Status: DC

## 2015-04-02 MED ORDER — AMITRIPTYLINE HCL 50 MG PO TABS: 50.0000 mg | ORAL_TABLET | Freq: Every day | ORAL | Status: DC

## 2015-04-02 NOTE — Telephone Encounter (Signed)
Gave 1 month of symbicort and amytriptyline, will have to wait till 04/06/15 for xanax, pt aware

## 2015-04-06 ENCOUNTER — Ambulatory Visit: Payer: Commercial Managed Care - HMO | Admitting: Family Medicine

## 2015-04-07 ENCOUNTER — Encounter: Payer: Self-pay | Admitting: Family Medicine

## 2015-05-19 ENCOUNTER — Ambulatory Visit (INDEPENDENT_AMBULATORY_CARE_PROVIDER_SITE_OTHER): Payer: Commercial Managed Care - HMO | Admitting: Family Medicine

## 2015-05-19 ENCOUNTER — Encounter: Payer: Self-pay | Admitting: Family Medicine

## 2015-05-19 VITALS — BP 140/85 | HR 80 | Temp 97.0°F | Ht 70.0 in | Wt 204.2 lb

## 2015-05-19 DIAGNOSIS — M1990 Unspecified osteoarthritis, unspecified site: Secondary | ICD-10-CM | POA: Diagnosis not present

## 2015-05-19 DIAGNOSIS — L219 Seborrheic dermatitis, unspecified: Secondary | ICD-10-CM

## 2015-05-19 DIAGNOSIS — L218 Other seborrheic dermatitis: Secondary | ICD-10-CM | POA: Diagnosis not present

## 2015-05-19 DIAGNOSIS — F411 Generalized anxiety disorder: Secondary | ICD-10-CM | POA: Diagnosis not present

## 2015-05-19 MED ORDER — ALPRAZOLAM 0.5 MG PO TABS: 0.2500 mg | ORAL_TABLET | Freq: Two times a day (BID) | ORAL | Status: DC | PRN

## 2015-05-19 MED ORDER — TERBINAFINE HCL 250 MG PO TABS: 250.0000 mg | ORAL_TABLET | Freq: Every day | ORAL | Status: DC

## 2015-05-19 MED ORDER — MELOXICAM 7.5 MG PO TABS: 7.5000 mg | ORAL_TABLET | Freq: Every day | ORAL | Status: DC

## 2015-05-19 MED ORDER — AMITRIPTYLINE HCL 50 MG PO TABS: 50.0000 mg | ORAL_TABLET | Freq: Every day | ORAL | Status: DC

## 2015-05-19 NOTE — Progress Notes (Signed)
BP 140/85 mmHg  Pulse 80  Temp(Src) 97 F (36.1 C) (Oral)  Ht  (1.778 m)  Wt 204 lb 3.2 oz (92.625 kg)  BMI 29.30 kg/m2   Subjective:    Patient ID: [name] Rollins, male    DOB: 10-20-1945, 70 y.o.   MRN: [id number]  HPI: [name] Rollins is a 70 y.o. male presenting on 05/19/2015 for Medication Refill; Rash; and Leg Pain   HPI Arthritis Patient has been having pain in both knees and both hips from osteoarthritis this is been told. He was given a prescription for Celebrex but it caused a lot of nausea and vomiting and palpitations and he did not like and would like to try something different. He was unable to take it long enough to see if that helped with his arthritis. He does take Tylenol which helped some.  Rash Patient has a very pruritic rash with small bumps at the base of his scalp at his hairline in the back. He also has a scaly pink rash around his eyebrows and under his eyes on the front as well. Both are very pruritic. Both have been there for at least a few months. There is been any drainage out of either one of them. He denies any fevers or chills.  Anxiety Patient is taking both Elavil and Xanax for his anxiety and needs a refill on them. He says he has been doing very well on them. He denies any suicidal ideations or thoughts of hurting himself.  Relevant past medical, surgical, family and social history reviewed and updated as indicated. Interim medical history since our last visit reviewed. Allergies and medications reviewed and updated.  Review of Systems  Constitutional: Negative for fever and chills.  HENT: Negative for ear discharge and ear pain.   Eyes: Negative for discharge and visual disturbance.  Respiratory: Negative for shortness of breath and wheezing.   Cardiovascular: Negative for chest pain and leg swelling.  Gastrointestinal: Negative for abdominal pain, diarrhea and constipation.  Genitourinary: Negative for difficulty urinating.    Musculoskeletal: Positive for arthralgias. Negative for back pain, joint swelling and gait problem.  Skin: Positive for rash.  Neurological: Negative for syncope, light-headedness and headaches.  Psychiatric/Behavioral: Positive for sleep disturbance. Negative for suicidal ideas, hallucinations, behavioral problems, confusion, self-injury, dysphoric mood, decreased concentration and agitation. The patient is nervous/anxious. The patient is not hyperactive.   All other systems reviewed and are negative.   Per HPI unless specifically indicated above     Medication List       This list is accurate as of: 05/19/15  8:41 AM.  Always use your most recent med list.               albuterol (2.5 MG/3ML) 0.083% nebulizer solution  Commonly known as:  PROVENTIL  Take 3 mLs (2.5 mg total) by nebulization every 6 (six) hours as needed for wheezing or shortness of breath.     albuterol 108 (90 Base) MCG/ACT inhaler  Commonly known as:  PROVENTIL HFA;VENTOLIN HFA  Inhale 2 puffs into the lungs every 6 (six) hours as needed for wheezing or shortness of breath.     ALPRAZolam 0.5 MG tablet  Commonly known as:  XANAX  Take 0.5 tablets (0.25 mg total) by mouth 2 (two) times daily as needed for anxiety.     amitriptyline 50 MG tablet  Commonly known as:  ELAVIL  Take 1 tablet (50 mg total) by mouth at bedtime.  budesonide-formoterol 160-4.5 MCG/ACT inhaler  Commonly known as:  SYMBICORT  Inhale 2 puffs into the lungs 2 (two) times daily.     ipratropium 0.02 % nebulizer solution  Commonly known as:  ATROVENT  Take 2.5 mLs (0.5 mg total) by nebulization 4 (four) times daily.     meloxicam 7.5 MG tablet  Commonly known as:  MOBIC  Take 1 tablet (7.5 mg total) by mouth daily.     oxybutynin 5 MG 24 hr tablet  Commonly known as:  DITROPAN-XL  Take 5 mg by mouth at bedtime.     tamsulosin 0.4 MG Caps capsule  Commonly known as:  FLOMAX  Take 1 capsule (0.4 mg total) by mouth daily.      terbinafine 250 MG tablet  Commonly known as:  LAMISIL  Take 1 tablet (250 mg total) by mouth daily.           Objective:    BP 140/85 mmHg  Pulse 80  Temp(Src) 97 F (36.1 C) (Oral)  Ht 5\' 10"  (1.778 m)  Wt 204 lb 3.2 oz (92.625 kg)  BMI 29.30 kg/m2  Wt Readings from Last 3 Encounters:  05/19/15 204 lb 3.2 oz (92.625 kg)  10/19/14 173 lb 6.4 oz (78.654 kg)  08/20/14 172 lb 9.6 oz (78.291 kg)    Physical Exam  Constitutional: He is oriented to person, place, and time. He appears well-developed and well-nourished. No distress.  Eyes: Conjunctivae and EOM are normal. Pupils are equal, round, and reactive to light. Right eye exhibits no discharge. No scleral icterus.  Neck: Neck supple. No thyromegaly present.  Cardiovascular: Normal rate, regular rhythm, normal heart sounds and intact distal pulses.   No murmur heard. Pulmonary/Chest: Effort normal and breath sounds normal. No respiratory distress. He has no wheezes.  Musculoskeletal: Normal range of motion. He exhibits no edema.  Generalized achiness and both knees and hips with movement in any direction. No sharp pains and no laxity of any of the joints.  Lymphadenopathy:    He has no cervical adenopathy.  Neurological: He is alert and oriented to person, place, and time. Coordination normal.  Skin: Skin is warm and dry. Rash noted. Rash is maculopapular (Maculopapular rash with scaliness at the base of the scalp and the back and in his eyebrows and under his eyes on his cheeks.). He is not diaphoretic.  Psychiatric: His behavior is normal. Thought content normal. His mood appears anxious. He does not exhibit a depressed mood. He expresses no suicidal ideation. He expresses no suicidal plans.  Nursing note and vitals reviewed.   Results for orders placed or performed during the hospital encounter of 07/08/14  Hemoglobin and hematocrit, blood  Result Value Ref Range   Hemoglobin 12.9 (L) 13.0 - 17.0 g/dL   HCT 14.740.3  82.939.0 - 56.252.0 %  Basic metabolic panel  Result Value Ref Range   Sodium 139 135 - 145 mmol/L   Potassium 4.2 3.5 - 5.1 mmol/L   Chloride 105 101 - 111 mmol/L   CO2 27 22 - 32 mmol/L   Glucose, Bld 111 (H) 65 - 99 mg/dL   BUN 11 6 - 20 mg/dL   Creatinine, Ser 1.300.66 0.61 - 1.24 mg/dL   Calcium 8.7 (L) 8.9 - 10.3 mg/dL   GFR calc non Af Amer >60 >60 mL/min   GFR calc Af Amer >60 >60 mL/min   Anion gap 7 5 - 15  Hemoglobin and hematocrit, blood  Result Value Ref Range   Hemoglobin  13.9 13.0 - 17.0 g/dL   HCT 16.1 09.6 - 04.5 %      Assessment & Plan:   Problem List Items Addressed This Visit      Musculoskeletal and Integument   Arthritis - Primary   Relevant Medications   meloxicam (MOBIC) 7.5 MG tablet    Other Visit Diagnoses    Anxiety state        Relevant Medications    ALPRAZolam (XANAX) 0.5 MG tablet    amitriptyline (ELAVIL) 50 MG tablet    Seborrheic dermatitis of scalp        Relevant Medications    terbinafine (LAMISIL) 250 MG tablet        Follow up plan: Return in about 2 months (around 07/19/2015), or if symptoms worsen or fail to improve, for Follow-up anxiety.  Counseling provided for all of the vaccine components No orders of the defined types were placed in this encounter.    Arville Care, MD [facility] 05/19/2015, 8:41 AM

## 2015-06-24 ENCOUNTER — Other Ambulatory Visit: Payer: Self-pay | Admitting: Family Medicine

## 2015-07-22 IMAGING — CR DG CHEST 2V
2 series · 2 of 2 positions shown · non-contrast
Comparison: 06/20/2013

CLINICAL DATA: Preop prostate resection.  Smoker.

EXAM:
CHEST  2 VIEW

[w chest pa]
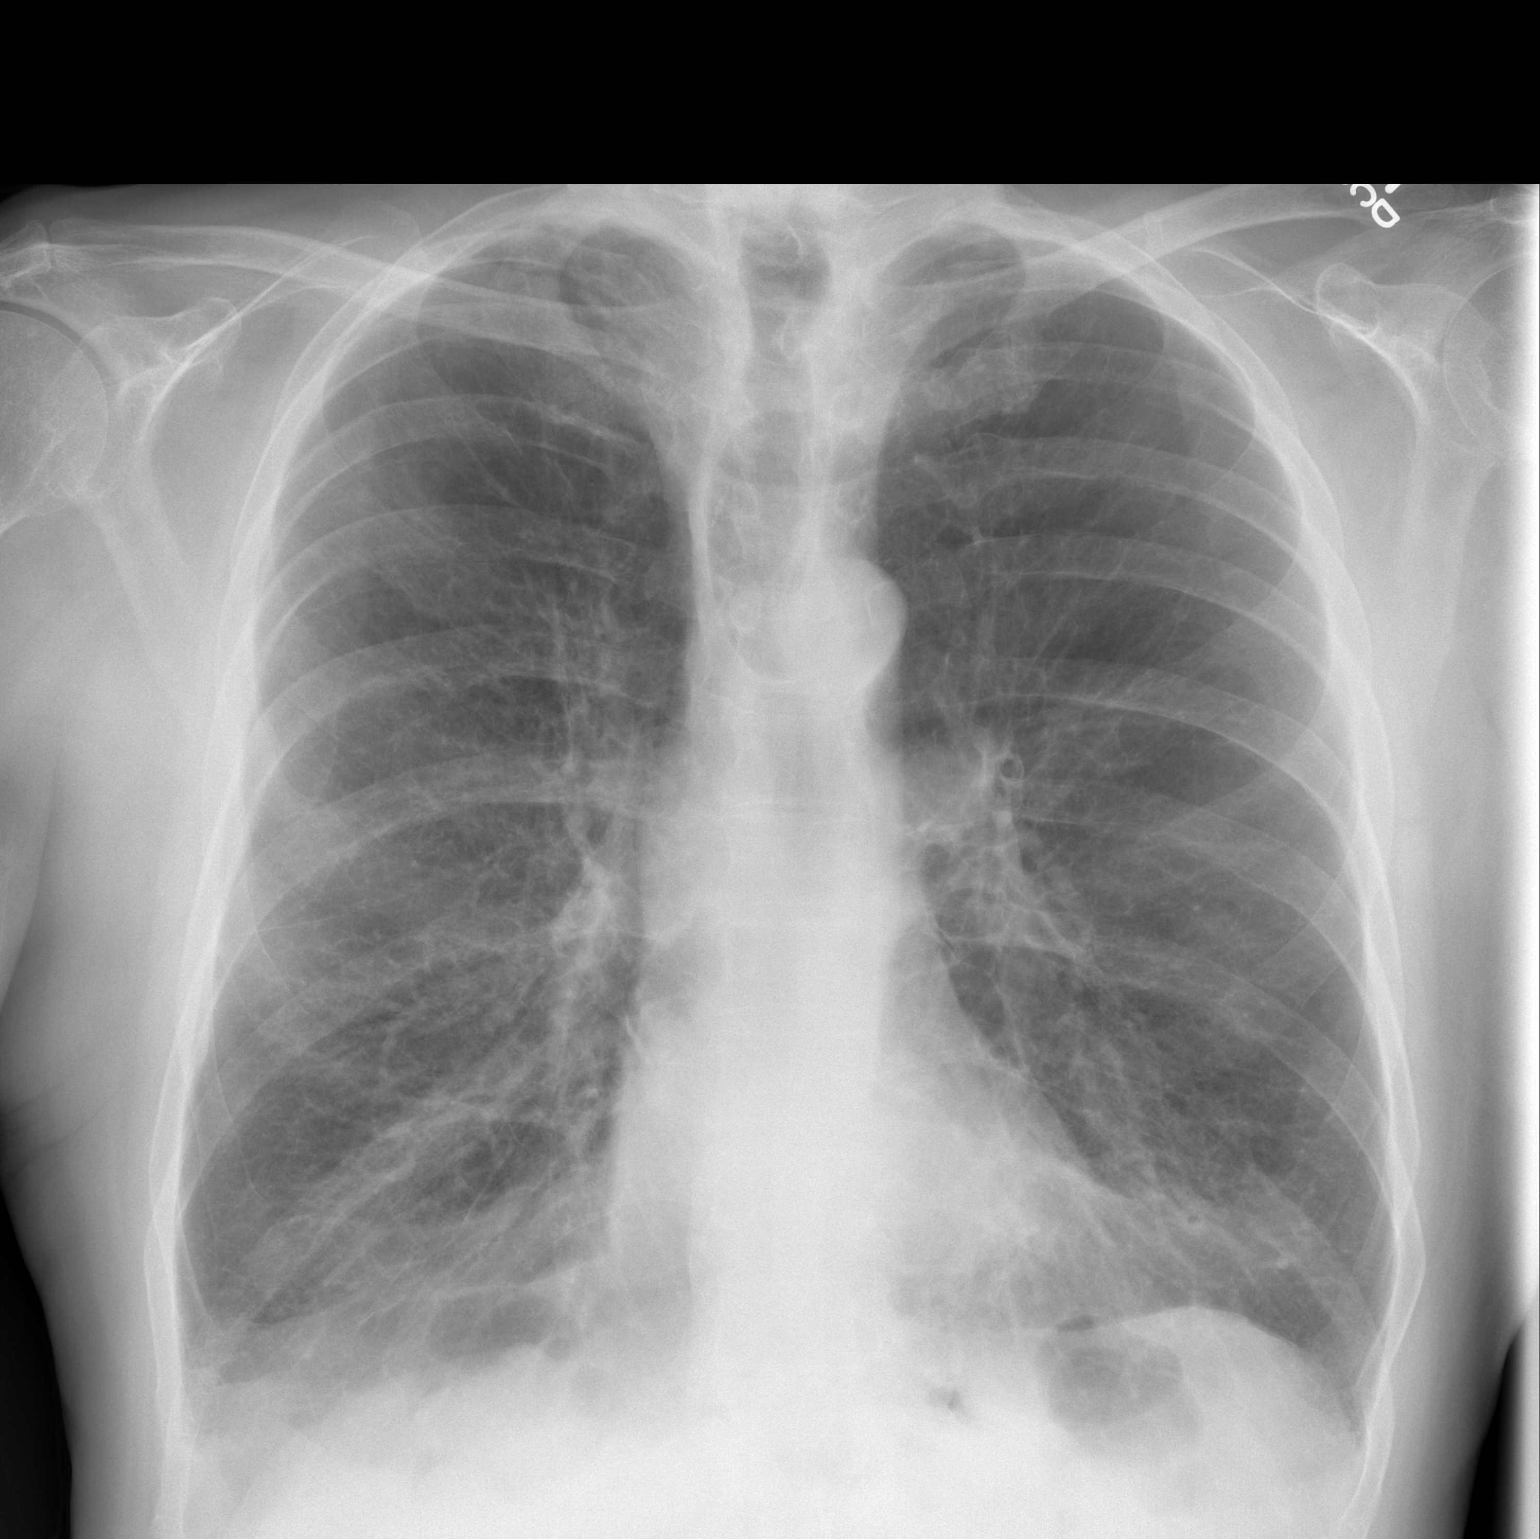

[w chest lat]
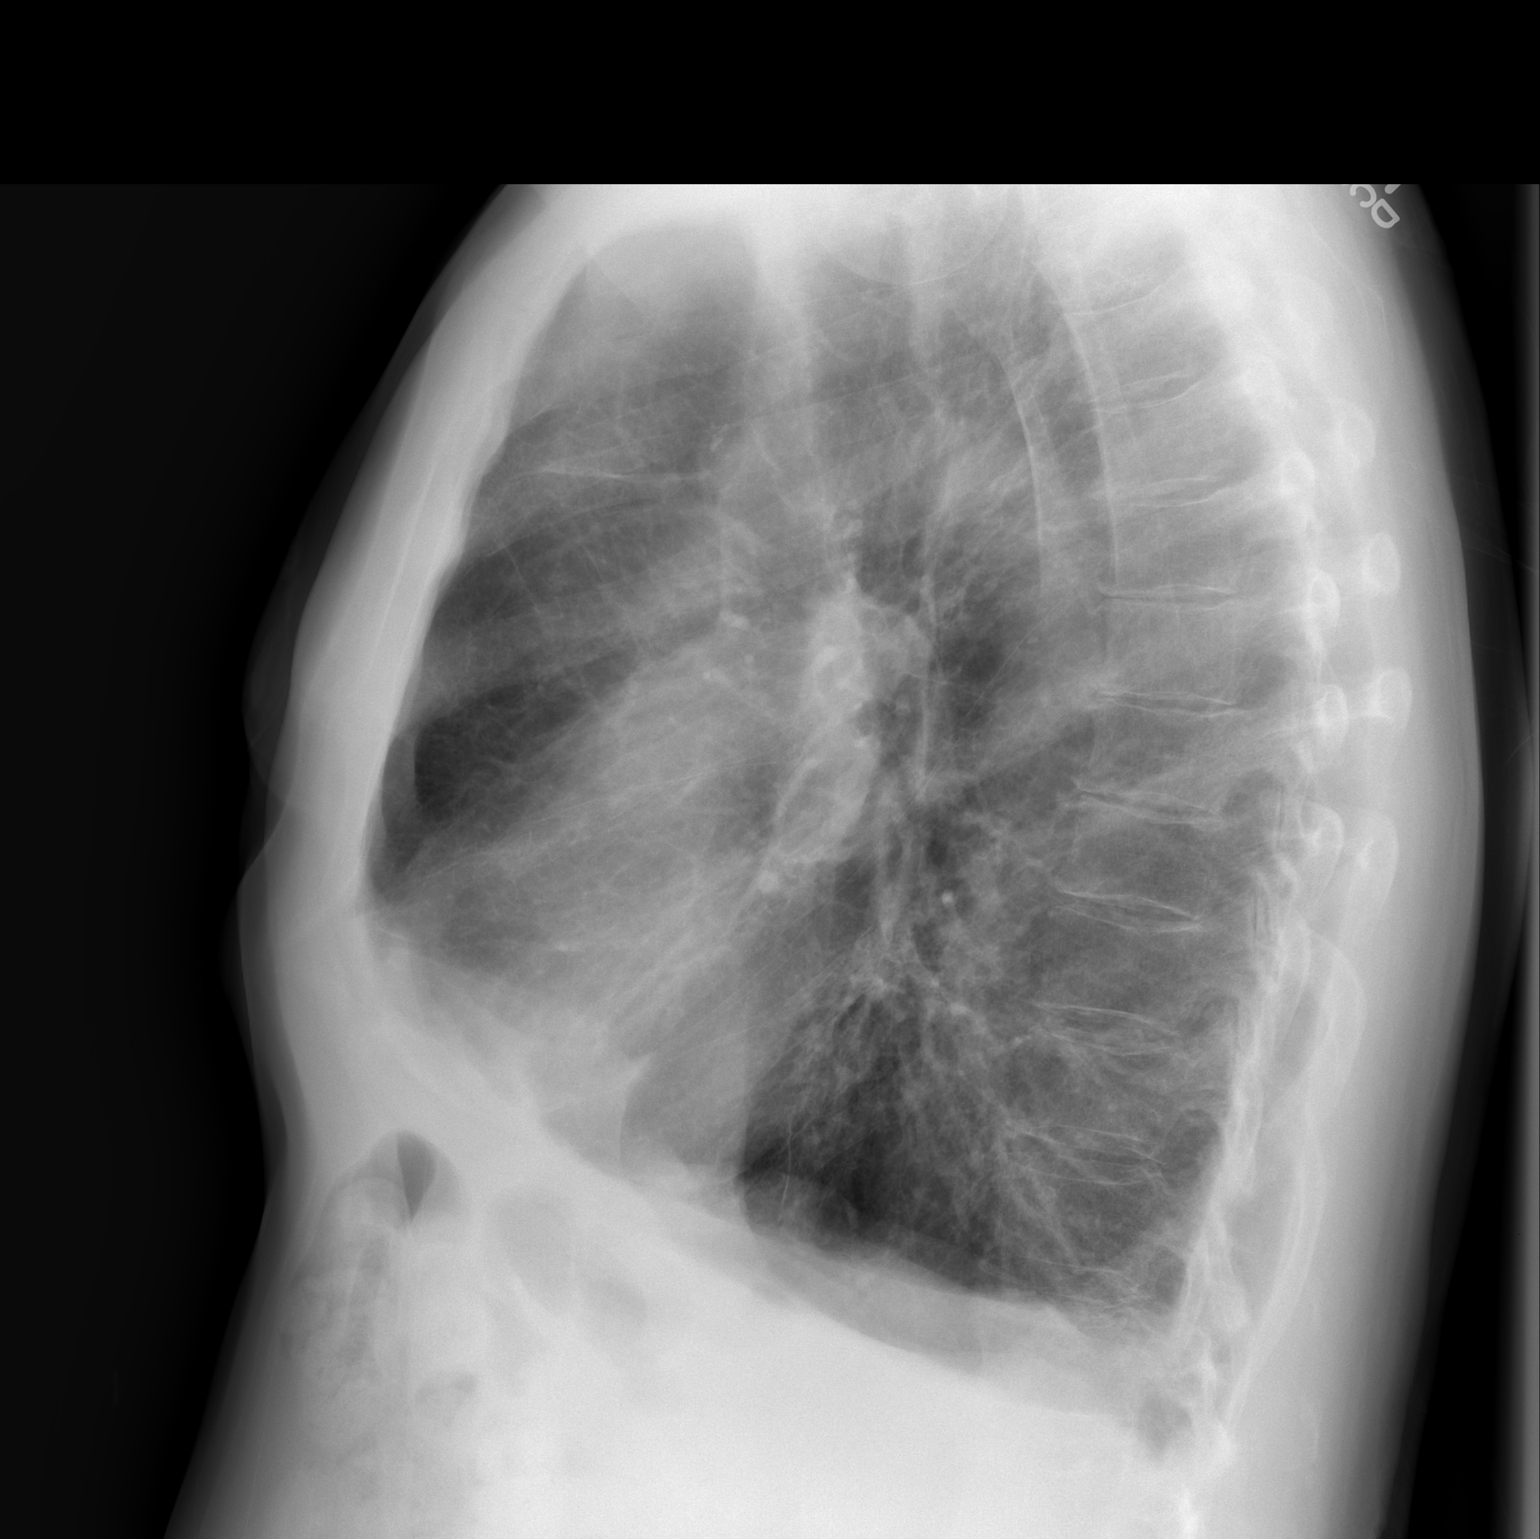

[2 of 2 positions shown; findings below may reference images not displayed]

FINDINGS: There is hyperinflation of the lungs compatible with COPD. Scarring
in the lung bases. Heart is normal size. No acute airspace opacities
or effusions. No acute bony abnormality.
IMPRESSION: COPD/chronic changes.  No active disease.

## 2015-09-06 ENCOUNTER — Other Ambulatory Visit: Payer: Self-pay | Admitting: Family Medicine

## 2015-09-06 DIAGNOSIS — F411 Generalized anxiety disorder: Secondary | ICD-10-CM

## 2015-09-08 NOTE — Telephone Encounter (Signed)
Patient needs an appointment to be seen. He was supposed to been seen in June

## 2015-09-28 ENCOUNTER — Encounter: Payer: Self-pay | Admitting: Family Medicine

## 2015-09-28 ENCOUNTER — Ambulatory Visit (INDEPENDENT_AMBULATORY_CARE_PROVIDER_SITE_OTHER): Payer: Commercial Managed Care - HMO | Admitting: Family Medicine

## 2015-09-28 VITALS — BP 111/68 | HR 81 | Temp 96.9°F | Ht 70.0 in | Wt 188.0 lb

## 2015-09-28 DIAGNOSIS — Z1211 Encounter for screening for malignant neoplasm of colon: Secondary | ICD-10-CM

## 2015-09-28 DIAGNOSIS — J449 Chronic obstructive pulmonary disease, unspecified: Secondary | ICD-10-CM

## 2015-09-28 DIAGNOSIS — G4700 Insomnia, unspecified: Secondary | ICD-10-CM | POA: Diagnosis not present

## 2015-09-28 DIAGNOSIS — F411 Generalized anxiety disorder: Secondary | ICD-10-CM

## 2015-09-28 DIAGNOSIS — E785 Hyperlipidemia, unspecified: Secondary | ICD-10-CM | POA: Diagnosis not present

## 2015-09-28 DIAGNOSIS — N400 Enlarged prostate without lower urinary tract symptoms: Secondary | ICD-10-CM | POA: Diagnosis not present

## 2015-09-28 MED ORDER — ALPRAZOLAM 0.5 MG PO TABS: 0.5000 mg | ORAL_TABLET | Freq: Every day | ORAL | 5 refills | Status: DC

## 2015-09-28 MED ORDER — LEVALBUTEROL TARTRATE 45 MCG/ACT IN AERO: 2.0000 | INHALATION_SPRAY | Freq: Four times a day (QID) | RESPIRATORY_TRACT | 12 refills | Status: DC

## 2015-09-28 NOTE — Progress Notes (Signed)
Subjective:  Patient ID: [name] Rollins, male    DOB: [birth year]  Age: 70 y.o. MRN: [id number]  CC: GAD (med refill)   HPI [name] Rollins presents for [employer] of his anxiety. GAD 7 shows his symptoms. His biggest concern is trouble relaxing this encompasses problems with sleep. He takes the Elavil an hour after his evening Xanax and that seems to help him relax well enough to sleep. He doesn't understand why he doesn't get more than 30 Xanax per month. He tells me that he is not using the albuterol inhaler because it makes him too shaky. He is having a good bit of trouble with shortness of breath.  GAD 7 : Generalized Anxiety Score 09/28/2015  Nervous, Anxious, on Edge 2  Control/stop worrying 2  Worry too much - different things 2  Trouble relaxing 3  Restless 2  Easily annoyed or irritable 2  Afraid - awful might happen 0  Total GAD 7 Score 13  Anxiety Difficulty Not difficult at all     History [name] Rollins has a past medical history of Anorexia; Anxiety; Arthritis; COPD (chronic obstructive pulmonary disease) ([place]); Foley catheter in place; Shortness of breath dyspnea; and Weight loss, unintentional (07/02/14).   He has a past surgical history that includes Replacement total knee; Joint replacement; Eye surgery (Bilateral); Fracture surgery; and Transurethral resection of prostate (N/A, 07/08/2014).   His family history includes Cancer in his father; Diabetes in his brother and brother.He reports that he quit smoking about a year ago. His smoking use included Cigarettes. He has a 25.00 pack-year smoking history. He has never used smokeless tobacco. He reports that he drinks alcohol. He reports that he does not use drugs.    ROS Review of Systems  Constitutional: Negative for chills, diaphoresis, fever and unexpected weight change.  HENT: Negative for congestion.   Eyes: Negative for visual disturbance.  Respiratory: Negative for cough and shortness of breath.   Cardiovascular:  Negative for chest pain.  Gastrointestinal: Negative for abdominal pain, constipation and diarrhea.  Genitourinary: Negative for dysuria and flank pain.  Musculoskeletal: Positive for arthralgias. Negative for joint swelling.  Skin: Negative for rash.  Neurological: Negative for dizziness and headaches.  Psychiatric/Behavioral: Negative for dysphoric mood and sleep disturbance.    Objective:  BP 111/68 (BP Location: Left Arm, Patient Position: Sitting, Cuff Size: Normal)   Pulse 81   Temp (!) 96.9 F (36.1 C) (Oral)   Ht _0  (1.778 m)   Wt 188 lb (85.3 kg)   SpO2 98%   BMI 26.98 kg/m   BP Readings from Last 3 Encounters:  09/28/15 111/68  05/19/15 140/85  10/19/14 121/72    Wt Readings from Last 3 Encounters:  09/28/15 188 lb (85.3 kg)  05/19/15 204 lb 3.2 oz (92.6 kg)  10/19/14 173 lb 6.4 oz (78.7 kg)     Physical Exam  Constitutional: He is oriented to person, place, and time. He appears well-developed and well-nourished.  HENT:  Head: Normocephalic and atraumatic.  Right Ear: Tympanic membrane and external ear normal. No decreased hearing is noted.  Left Ear: Tympanic membrane and external ear normal. No decreased hearing is noted.  Mouth/Throat: No oropharyngeal exudate or posterior oropharyngeal erythema.  Eyes: Pupils are equal, round, and reactive to light.  Neck: Normal range of motion. Neck supple.  Cardiovascular: Normal rate and regular rhythm.   No murmur heard. Pulmonary/Chest: No respiratory distress. He has wheezes (breath sounds distant). He has no rales. He  exhibits no tenderness.  Abdominal: Soft. Bowel sounds are normal. He exhibits no mass. There is no tenderness.  Neurological: He is alert and oriented to person, place, and time.  Vitals reviewed.    Lab Results  Component Value Date   WBC 6.8 07/02/2014   HGB 13.9 07/09/2014   HCT 43.3 07/09/2014   PLT 305 07/02/2014   GLUCOSE 111 (H) 07/09/2014   CHOL 140 06/04/2014   TRIG 81  06/04/2014   HDL 45 06/04/2014   LDLCALC 79 06/04/2014   ALT 18 06/04/2014   AST 34 06/04/2014   NA 139 07/09/2014   K 4.2 07/09/2014   CL 105 07/09/2014   CREATININE 0.66 07/09/2014   BUN 11 07/09/2014   CO2 27 07/09/2014   TSH 2.990 12/09/2012   PSA 3.0 12/09/2012    Dg Chest 2 View  Result Date: 07/02/2014 CLINICAL DATA:  Preop prostate resection.  Smoker. EXAM: CHEST  2 VIEW COMPARISON:  06/20/2013 FINDINGS: There is hyperinflation of the lungs compatible with COPD. Scarring in the lung bases. Heart is normal size. No acute airspace opacities or effusions. No acute bony abnormality. IMPRESSION: COPD/chronic changes.  No active disease. Electronically Signed   By: Rolm Baptise M.D.   On: 07/02/2014 17:02    Assessment & Plan:   [name] Rollins was seen today for gad.  Diagnoses and all orders for this visit:  Chronic obstructive pulmonary disease, unspecified COPD type ([place]) -     CBC with Differential/Platelet -     CMP14+EGFR  BPH (benign prostatic hyperplasia) -     CBC with Differential/Platelet -     CMP14+EGFR  Hyperlipidemia -     CBC with Differential/Platelet -     CMP14+EGFR -     Lipid panel  Insomnia -     CBC with Differential/Platelet -     CMP14+EGFR  GAD (generalized anxiety disorder) -     CBC with Differential/Platelet -     CMP14+EGFR -     ALPRAZolam (XANAX) 0.5 MG tablet; Take 1 tablet (0.5 mg total) by mouth at bedtime.  Anxiety state -     CBC with Differential/Platelet -     CMP14+EGFR  Special screening for malignant neoplasms, colon -     Ambulatory referral to Gastroenterology -     CBC with Differential/Platelet -     CMP14+EGFR -     PSA Total (Reflex To Free)  Other orders -     levalbuterol (XOPENEX HFA) 45 MCG/ACT inhaler; Inhale 2 puffs into the lungs 4 (four) times daily.      I have discontinued [name] Rollins albuterol, albuterol, and terbinafine. I have also changed his ALPRAZolam. Additionally, I am having him start on  levalbuterol. Lastly, I am having him maintain his ipratropium, tamsulosin, oxybutynin, budesonide-formoterol, meloxicam, and amitriptyline.  Meds ordered this encounter  Medications  . ALPRAZolam (XANAX) 0.5 MG tablet    Sig: Take 1 tablet (0.5 mg total) by mouth at bedtime.    Dispense:  30 tablet    Refill:  5  . levalbuterol (XOPENEX HFA) 45 MCG/ACT inhaler    Sig: Inhale 2 puffs into the lungs 4 (four) times daily.    Dispense:  1 Inhaler    Refill:  12     Follow-up: No Follow-up on file.  Claretta Fraise, M.D.

## 2015-09-29 ENCOUNTER — Other Ambulatory Visit: Payer: Self-pay | Admitting: *Deleted

## 2015-09-29 ENCOUNTER — Telehealth: Payer: Self-pay | Admitting: Family Medicine

## 2015-09-29 DIAGNOSIS — M1990 Unspecified osteoarthritis, unspecified site: Secondary | ICD-10-CM

## 2015-09-29 LAB — LIPID PANEL
Chol/HDL Ratio: 3.3 ratio (ref 0.0–5.0)
Cholesterol, Total: 132 mg/dL (ref 100–199)
HDL: 40 mg/dL
LDL Calculated: 77 mg/dL (ref 0–99)
Triglycerides: 75 mg/dL (ref 0–149)
VLDL Cholesterol Cal: 15 mg/dL (ref 5–40)

## 2015-09-29 LAB — CMP14+EGFR
ALT: 12 IU/L (ref 0–44)
AST: 22 IU/L (ref 0–40)
Albumin/Globulin Ratio: 1 — ABNORMAL LOW (ref 1.2–2.2)
Albumin: 3.9 g/dL (ref 3.5–4.8)
Alkaline Phosphatase: 92 IU/L (ref 39–117)
BUN/Creatinine Ratio: 9 — ABNORMAL LOW (ref 10–24)
BUN: 7 mg/dL — ABNORMAL LOW (ref 8–27)
Bilirubin Total: 0.3 mg/dL (ref 0.0–1.2)
CO2: 26 mmol/L (ref 18–29)
Calcium: 9.4 mg/dL (ref 8.6–10.2)
Chloride: 100 mmol/L (ref 96–106)
Creatinine, Ser: 0.74 mg/dL — ABNORMAL LOW (ref 0.76–1.27)
GFR calc Af Amer: 108 mL/min/1.73 (ref >59)
GFR calc non Af Amer: 93 mL/min/1.73 (ref >59)
Globulin, Total: 4 g/dL (ref 1.5–4.5)
Glucose: 83 mg/dL (ref 65–99)
Potassium: 5.7 mmol/L — ABNORMAL HIGH (ref 3.5–5.2)
Sodium: 139 mmol/L (ref 134–144)
Total Protein: 7.9 g/dL (ref 6.0–8.5)

## 2015-09-29 LAB — CBC WITH DIFFERENTIAL/PLATELET
Basophils Absolute: 0 x10E3/uL (ref 0.0–0.2)
Basos: 0 %
EOS (ABSOLUTE): 0.1 x10E3/uL (ref 0.0–0.4)
Eos: 2 %
Hematocrit: 51.2 % — ABNORMAL HIGH (ref 37.5–51.0)
Hemoglobin: 16.7 g/dL (ref 12.6–17.7)
Immature Grans (Abs): 0 x10E3/uL (ref 0.0–0.1)
Immature Granulocytes: 1 %
Lymphocytes Absolute: 1.6 x10E3/uL (ref 0.7–3.1)
Lymphs: 26 %
MCH: 30.5 pg (ref 26.6–33.0)
MCHC: 32.6 g/dL (ref 31.5–35.7)
MCV: 94 fL (ref 79–97)
Monocytes Absolute: 0.6 x10E3/uL (ref 0.1–0.9)
Monocytes: 9 %
Neutrophils Absolute: 3.8 x10E3/uL (ref 1.4–7.0)
Neutrophils: 62 %
Platelets: 366 x10E3/uL (ref 150–379)
RBC: 5.47 x10E6/uL (ref 4.14–5.80)
RDW: 13.7 % (ref 12.3–15.4)
WBC: 6.1 x10E3/uL (ref 3.4–10.8)

## 2015-09-29 LAB — PSA TOTAL (REFLEX TO FREE): Prostate Specific Ag, Serum: 2.7 ng/mL (ref 0.0–4.0)

## 2015-09-29 MED ORDER — MELOXICAM 7.5 MG PO TABS: 7.5000 mg | ORAL_TABLET | Freq: Every day | ORAL | 5 refills | Status: DC

## 2015-09-29 MED ORDER — AMITRIPTYLINE HCL 50 MG PO TABS: 50.0000 mg | ORAL_TABLET | Freq: Every day | ORAL | 5 refills | Status: DC

## 2015-09-29 NOTE — Telephone Encounter (Signed)
Patient had xanax script to be taken to pharmacy.  Dr. Sharen HonesStack's nurse is sending in refills for all other medications.

## 2015-10-14 ENCOUNTER — Telehealth: Payer: Self-pay | Admitting: Family Medicine

## 2015-10-14 ENCOUNTER — Other Ambulatory Visit: Payer: Self-pay | Admitting: *Deleted

## 2015-10-14 DIAGNOSIS — J410 Simple chronic bronchitis: Secondary | ICD-10-CM

## 2015-10-14 MED ORDER — BUDESONIDE-FORMOTEROL FUMARATE 160-4.5 MCG/ACT IN AERO: 2.0000 | INHALATION_SPRAY | Freq: Two times a day (BID) | RESPIRATORY_TRACT | 5 refills | Status: DC

## 2015-10-14 NOTE — Telephone Encounter (Signed)
He uses three. I feel sure he is referring to xopenex. Verify with him. If that is the case change to proair instead. 2 puff QID prn breathing Refill for 12 mos

## 2015-10-14 NOTE — Telephone Encounter (Signed)
Pt requested refill on Symbicort Okayed per Dr Darlyn ReadStacks

## 2015-10-14 NOTE — Progress Notes (Signed)
Pt requesting refill of Symbicort Okayed per Dr Darlyn ReadStacks

## 2016-04-04 ENCOUNTER — Other Ambulatory Visit: Payer: Self-pay | Admitting: Family Medicine

## 2016-04-04 NOTE — Telephone Encounter (Signed)
Patient last seen 09/28/15, appointment made to see Dr. Darlyn ReadStacks on 04/05/16 at 3:25 pm.

## 2016-04-05 ENCOUNTER — Other Ambulatory Visit: Payer: Self-pay | Admitting: Family Medicine

## 2016-04-05 ENCOUNTER — Encounter: Payer: Self-pay | Admitting: Family Medicine

## 2016-04-05 ENCOUNTER — Ambulatory Visit (INDEPENDENT_AMBULATORY_CARE_PROVIDER_SITE_OTHER): Payer: Medicare HMO | Admitting: Family Medicine

## 2016-04-05 VITALS — BP 134/75 | HR 89 | Temp 97.0°F | Ht 70.0 in | Wt 193.0 lb

## 2016-04-05 DIAGNOSIS — M1990 Unspecified osteoarthritis, unspecified site: Secondary | ICD-10-CM

## 2016-04-05 DIAGNOSIS — F5101 Primary insomnia: Secondary | ICD-10-CM

## 2016-04-05 DIAGNOSIS — J449 Chronic obstructive pulmonary disease, unspecified: Secondary | ICD-10-CM | POA: Diagnosis not present

## 2016-04-05 DIAGNOSIS — F411 Generalized anxiety disorder: Secondary | ICD-10-CM | POA: Diagnosis not present

## 2016-04-05 DIAGNOSIS — J410 Simple chronic bronchitis: Secondary | ICD-10-CM

## 2016-04-05 MED ORDER — ALPRAZOLAM 0.5 MG PO TABS: 0.5000 mg | ORAL_TABLET | Freq: Every day | ORAL | 5 refills | Status: DC

## 2016-04-05 MED ORDER — AMITRIPTYLINE HCL 75 MG PO TABS: 75.0000 mg | ORAL_TABLET | Freq: Every day | ORAL | 1 refills | Status: DC

## 2016-04-05 NOTE — Progress Notes (Signed)
Subjective:  Patient ID: [name] Rollins, male    DOB: April 12, 1945  Age: 71 y.o. MRN: [id number]  CC: COPD (pt here today for routine follow up on COPD and also c/o insomnia. He states the xanax and Elavil isn't helping.)   HPI [name] Rollins presents for [employer] of his COPD. He has not had any respiratory distress recently. He does occasionally have wheezing. He denies having any currently. Cough has been sparse and dry. Of note is that he quit smoking 3 months ago. That has helped with his breathing although he had worse cough at first. He continues to use his Symbicort regularly. Refills need to be sent in. He is only using the nebulizer when he gets short of breath. He has a Xopenex inhaler he uses due to feeling excessively jittery from albuterol.  Additionally urinary symptoms have been stable as long as he uses the oxybutynin. This along with the Flomax keeps him from having to get up to go to the bathroom at night. He is not leaking urine.    He states that the alprazolam and Elavil have not helped with his sleep. He is still awake for a couple of hours after trying to go to sleep. Sometimes he wakes up during the night as well. The problem is getting worse. It has been present for years. Initially meds worked better than they do now.  Taking alprazolam does seem to be controlling his anxiety symptoms. Review of GAD is unchanged from 09/28/2015. It is improved with use of the Xanax at bedtime. [facility]/2016  Decreased Interest 0  Down, Depressed, Hopeless 0  PHQ - 2 Score 0   GAD 7 : Generalized Anxiety Score 09/28/2015  Nervous, Anxious, on Edge 2  Control/stop worrying 2  Worry too much - different things 2  Trouble relaxing 3  Restless 2  Easily annoyed or irritable 2  Afraid - awful might happen 0  Total GAD 7 Score 13  Anxiety Difficulty Not difficult at all       History [name] Rollins has a past medical history of Anorexia; Anxiety; Arthritis;  COPD (chronic obstructive pulmonary disease) ([facility]); Foley catheter in place; Shortness of breath dyspnea; and Weight loss, unintentional (07/02/14).   He has a past surgical history that includes Replacement total knee; Joint replacement; Eye surgery (Bilateral); Fracture surgery; and Transurethral resection of prostate (N/A, 07/08/2014).   His family history includes Cancer in his father; Diabetes in his brother and brother.He reports that he quit smoking about 17 months ago. His smoking use included Cigarettes. He has a 25.00 pack-year smoking history. He has never used smokeless tobacco. He reports that he drinks alcohol. He reports that he does not use drugs.    ROS Review of Systems  Constitutional: Negative for chills, diaphoresis and fever.  HENT: Negative for rhinorrhea and sore throat.   Respiratory: Negative for cough and shortness of breath.   Cardiovascular: Negative for chest pain.  Gastrointestinal: Negative for abdominal pain.  Musculoskeletal: Negative for arthralgias and myalgias.  Skin: Negative for rash.  Neurological: Negative for weakness and headaches.  Psychiatric/Behavioral: Positive for sleep disturbance.    Objective:  BP 134/75   Pulse 89   Temp 97 F (36.1 C) (Oral)   Ht 5\' 10"  (1.778 m)   Wt 193 lb (87.5 kg)   BMI 27.69 kg/m   BP Readings from Last 3 Encounters:  04/05/16 134/75  09/28/15 111/68  05/19/15 140/85    Wt  Readings from Last 3 Encounters:  04/05/16 193 lb (87.5 kg)  09/28/15 188 lb (85.3 kg)  05/19/15 204 lb 3.2 oz (92.6 kg)     Physical Exam  Constitutional: He appears well-developed and well-nourished.  HENT:  Head: Normocephalic and atraumatic.  Right Ear: Tympanic membrane and external ear normal. No decreased hearing is noted.  Left Ear: Tympanic membrane and external ear normal. No decreased hearing is noted.  Mouth/Throat: No oropharyngeal exudate or posterior oropharyngeal erythema.  Eyes: Pupils are equal, round, and  reactive to light.  Neck: Normal range of motion. Neck supple.  Cardiovascular: Normal rate and regular rhythm.   No murmur heard. Pulmonary/Chest: Breath sounds normal. No respiratory distress.  Abdominal: Soft. Bowel sounds are normal. He exhibits no mass. There is no tenderness.  Skin: Skin is warm and dry.  Psychiatric: He has a normal mood and affect.  Vitals reviewed.     Assessment & Plan:   Ramadan was seen today for copd.  Diagnoses and all orders for this visit:  Chronic obstructive pulmonary disease, unspecified COPD type ([facility]) -     PR BREATHING CAPACITY TEST  Primary insomnia  GAD (generalized anxiety disorder) -     ALPRAZolam (XANAX) 0.5 MG tablet; Take 1 tablet (0.5 mg total) by mouth at bedtime.  Other orders -     amitriptyline (ELAVIL) 75 MG tablet; Take 1 tablet (75 mg total) by mouth at bedtime.       I have changed Mr. Levario amitriptyline. I am also having him maintain his ipratropium, tamsulosin, oxybutynin, levalbuterol, and ALPRAZolam.  Allergies as of 04/05/2016      Reactions   Codeine Itching   Celebrex [celecoxib] Nausea And Vomiting, Palpitations      Medication List       Accurate as of 04/05/16 11:59 PM. Always use your most recent med list.          ALPRAZolam 0.5 MG tablet Commonly known as:  XANAX Take 1 tablet (0.5 mg total) by mouth at bedtime.   amitriptyline 75 MG tablet Commonly known as:  ELAVIL Take 1 tablet (75 mg total) by mouth at bedtime.   ipratropium 0.02 % nebulizer solution Commonly known as:  ATROVENT Take 2.5 mLs (0.5 mg total) by nebulization 4 (four) times daily.   levalbuterol 45 MCG/ACT inhaler Commonly known as:  XOPENEX HFA Inhale 2 puffs into the lungs 4 (four) times daily.   meloxicam 7.5 MG tablet Commonly known as:  MOBIC TAKE ONE TABLET BY MOUTH ONCE DAILY   oxybutynin 5 MG 24 hr tablet Commonly known as:  DITROPAN-XL Take 5 mg by mouth at bedtime.   SYMBICORT 160-4.5 MCG/ACT  inhaler Generic drug:  budesonide-formoterol INHALE TWO PUFFS BY MOUTH TWICE DAILY   tamsulosin 0.4 MG Caps capsule Commonly known as:  FLOMAX Take 1 capsule (0.4 mg total) by mouth daily.      Sleep hygiene reviewed with the patient.   Follow-up: Return in about 6 months (around 10/03/2016) for COPD.  Mechele Claude, M.D.

## 2016-10-02 ENCOUNTER — Other Ambulatory Visit: Payer: Self-pay | Admitting: Family Medicine

## 2016-10-02 DIAGNOSIS — J410 Simple chronic bronchitis: Secondary | ICD-10-CM

## 2016-10-02 MED ORDER — AMITRIPTYLINE HCL 75 MG PO TABS: 75.0000 mg | ORAL_TABLET | Freq: Every day | ORAL | 0 refills | Status: DC

## 2016-10-02 MED ORDER — BUDESONIDE-FORMOTEROL FUMARATE 160-4.5 MCG/ACT IN AERO: 2.0000 | INHALATION_SPRAY | Freq: Two times a day (BID) | RESPIRATORY_TRACT | 1 refills | Status: DC

## 2016-10-02 NOTE — Telephone Encounter (Signed)
Pt aware refilled - he will keep appt

## 2016-10-06 ENCOUNTER — Encounter: Payer: Self-pay | Admitting: Family Medicine

## 2016-10-06 ENCOUNTER — Ambulatory Visit (INDEPENDENT_AMBULATORY_CARE_PROVIDER_SITE_OTHER): Payer: Medicare HMO | Admitting: Family Medicine

## 2016-10-06 VITALS — BP 137/73 | HR 91 | Temp 97.0°F | Ht 70.0 in | Wt 201.0 lb

## 2016-10-06 DIAGNOSIS — F411 Generalized anxiety disorder: Secondary | ICD-10-CM | POA: Diagnosis not present

## 2016-10-06 DIAGNOSIS — N400 Enlarged prostate without lower urinary tract symptoms: Secondary | ICD-10-CM

## 2016-10-06 DIAGNOSIS — J449 Chronic obstructive pulmonary disease, unspecified: Secondary | ICD-10-CM

## 2016-10-06 DIAGNOSIS — J410 Simple chronic bronchitis: Secondary | ICD-10-CM | POA: Diagnosis not present

## 2016-10-06 MED ORDER — ALPRAZOLAM 0.5 MG PO TABS: 0.5000 mg | ORAL_TABLET | Freq: Every day | ORAL | 5 refills | Status: AC

## 2016-10-06 MED ORDER — BUDESONIDE-FORMOTEROL FUMARATE 160-4.5 MCG/ACT IN AERO
2.0000 | INHALATION_SPRAY | Freq: Two times a day (BID) | RESPIRATORY_TRACT | 1 refills | Status: AC
Start: 2016-10-06 — End: ?

## 2016-10-06 MED ORDER — PANTOPRAZOLE SODIUM 40 MG PO TBEC: 40.0000 mg | DELAYED_RELEASE_TABLET | Freq: Every day | ORAL | 5 refills | Status: AC

## 2016-10-06 MED ORDER — AMITRIPTYLINE HCL 75 MG PO TABS: 75.0000 mg | ORAL_TABLET | Freq: Every day | ORAL | 0 refills | Status: DC

## 2016-10-06 NOTE — Progress Notes (Signed)
Subjective:  Patient ID: [name] Rollins, male    DOB: 1945/10/17  Age: 71 y.o. MRN: [id number]  CC: COPD (pt here today for routine follow up of chronic medical conditions and also c/o heartburn)   HPI [name] Rollins presents for follow-up of COPD. He is breathingbetter since he quit smoking 6 months ago, but has gained weight. He still as to use his inhaler though. His anxiety is under good control as long as he takes hisxanax. This is also better since he quit smoking. (Pt. Unable to complete GAD 7)He needs refills on all of these. He notes that he does not have to urinate at night as often as he used to, so over the last few months he's taken himself off of his prostate medication.  Depression screen [facility] 2/9 10/06/2016 04/05/2016 09/28/2015  Decreased Interest 0 0 0  Down, Depressed, Hopeless 0 0 1  PHQ - 2 Score 0 0 1    History [name] Rollins has a past medical history of Anorexia; Anxiety; Arthritis; COPD (chronic obstructive pulmonary disease) ([place]); Foley catheter in place; Shortness of breath dyspnea; and Weight loss, unintentional (07/02/14).   He has a past surgical history that includes Replacement total knee; Joint replacement; Eye surgery (Bilateral); Fracture surgery; and Transurethral resection of prostate (N/A, 07/08/2014).   His family history includes Cancer in his father; Diabetes in his brother and brother.He reports that he quit smoking about 1 years ago. His smoking use included Cigarettes. He has a 25.00 pack-year smoking history. He has never used smokeless tobacco. He reports that he drinks alcohol. He reports that he does not use drugs.    ROS Review of Systems  Constitutional: Negative for chills, diaphoresis and fever.  HENT: Negative for rhinorrhea and sore throat.   Respiratory: Negative for cough and shortness of breath.   Cardiovascular: Negative for chest pain.  Gastrointestinal: Negative for abdominal pain.  Musculoskeletal: Negative for arthralgias and  myalgias.  Skin: Negative for rash.  Neurological: Negative for weakness and headaches.    Objective:  BP 137/73   Pulse 91   Temp (!) 97 F (36.1 C) (Oral)   Ht '5\' 10"'$  (1.778 m)   Wt 201 lb (91.2 kg)   SpO2 96%   BMI 28.84 kg/m   BP Readings from Last 3 Encounters:  10/06/16 137/73  04/05/16 134/75  09/28/15 111/68    Wt Readings from Last 3 Encounters:  10/06/16 201 lb (91.2 kg)  04/05/16 193 lb (87.5 kg)  09/28/15 188 lb (85.3 kg)     Physical Exam  Constitutional: He is oriented to person, place, and time. He appears well-developed and well-nourished. No distress.  HENT:  Head: Normocephalic and atraumatic.  Right Ear: External ear normal.  Left Ear: External ear normal.  Nose: Nose normal.  Mouth/Throat: Oropharynx is clear and moist.  Eyes: Pupils are equal, round, and reactive to light. Conjunctivae and EOM are normal.  Neck: Normal range of motion. Neck supple. No thyromegaly present.  Cardiovascular: Normal rate, regular rhythm and normal heart sounds.   No murmur heard. Pulmonary/Chest: Effort normal and breath sounds normal. No respiratory distress. He has no wheezes. He has no rales.  Abdominal: Soft. Bowel sounds are normal. He exhibits no distension. There is no tenderness.  Lymphadenopathy:    He has no cervical adenopathy.  Neurological: He is alert and oriented to person, place, and time. He has normal reflexes.  Skin: Skin is warm and dry.  Psychiatric: He has a  normal mood and affect. His behavior is normal. Judgment and thought content normal.      Assessment & Plan:   [name] Rollins was seen today for copd.  Diagnoses and all orders for this visit:  Chronic obstructive pulmonary disease, unspecified COPD type ([place]) -     CBC with Differential/Platelet -     CMP14+EGFR  GAD (generalized anxiety disorder) -     ALPRAZolam (XANAX) 0.5 MG tablet; Take 1 tablet (0.5 mg total) by mouth at bedtime.  Benign prostatic hyperplasia without lower  urinary tract symptoms  Simple chronic bronchitis (HCC) -     budesonide-formoterol (SYMBICORT) 160-4.5 MCG/ACT inhaler; Inhale 2 puffs into the lungs 2 (two) times daily.  Other orders -     pantoprazole (PROTONIX) 40 MG tablet; Take 1 tablet (40 mg total) by mouth daily. -     amitriptyline (ELAVIL) 75 MG tablet; Take 1 tablet (75 mg total) by mouth at bedtime.       I have discontinued [name] ipratropium, tamsulosin, oxybutynin, levalbuterol, and meloxicam. I am also having him start on pantoprazole. Additionally, I am having him maintain his ALPRAZolam, budesonide-formoterol, and amitriptyline.  Allergies as of 10/06/2016      Reactions   Codeine Itching   Celebrex [celecoxib] Nausea And Vomiting, Palpitations      Medication List       Accurate as of 10/06/16  5:25 PM. Always use your most recent med list.          ALPRAZolam 0.5 MG tablet Commonly known as:  XANAX Take 1 tablet (0.5 mg total) by mouth at bedtime.   amitriptyline 75 MG tablet Commonly known as:  ELAVIL Take 1 tablet (75 mg total) by mouth at bedtime.   budesonide-formoterol 160-4.5 MCG/ACT inhaler Commonly known as:  SYMBICORT Inhale 2 puffs into the lungs 2 (two) times daily.   pantoprazole 40 MG tablet Commonly known as:  PROTONIX Take 1 tablet (40 mg total) by mouth daily.            Discharge Care Instructions        Start     Ordered   10/06/16 0000  CBC with Differential/Platelet     10/06/16 1532   10/06/16 0000  CMP14+EGFR     10/06/16 1532   10/06/16 0000  pantoprazole (PROTONIX) 40 MG tablet  Daily     10/06/16 1532   10/06/16 0000  ALPRAZolam (XANAX) 0.5 MG tablet  Daily at bedtime     10/06/16 1532   10/06/16 0000  budesonide-formoterol (SYMBICORT) 160-4.5 MCG/ACT inhaler  2 times daily    Comments:  Please consider 90 day supplies to promote better adherence   10/06/16 1532   10/06/16 0000  amitriptyline (ELAVIL) 75 MG tablet  Daily at bedtime     10/06/16 1532        Follow-up: Return in about 6 months (around 04/05/2017).  Claretta Fraise, M.D.

## 2016-10-07 LAB — CBC WITH DIFFERENTIAL/PLATELET
Basophils Absolute: 0 10*3/uL (ref 0.0–0.2)
Basos: 0 %
EOS (ABSOLUTE): 0.2 10*3/uL (ref 0.0–0.4)
EOS: 3 %
HEMATOCRIT: 46 % (ref 37.5–51.0)
HEMOGLOBIN: 15.6 g/dL (ref 13.0–17.7)
Immature Grans (Abs): 0.1 10*3/uL (ref 0.0–0.1)
Immature Granulocytes: 1 %
LYMPHS ABS: 1.4 10*3/uL (ref 0.7–3.1)
Lymphs: 22 %
MCH: 31.5 pg (ref 26.6–33.0)
MCHC: 33.9 g/dL (ref 31.5–35.7)
MCV: 93 fL (ref 79–97)
MONOCYTES: 10 %
Monocytes Absolute: 0.7 10*3/uL (ref 0.1–0.9)
NEUTROS ABS: 4.1 10*3/uL (ref 1.4–7.0)
Neutrophils: 64 %
Platelets: 346 10*3/uL (ref 150–379)
RBC: 4.96 x10E6/uL (ref 4.14–5.80)
RDW: 14.2 % (ref 12.3–15.4)
WBC: 6.4 10*3/uL (ref 3.4–10.8)

## 2016-10-07 LAB — CMP14+EGFR
ALBUMIN: 4 g/dL (ref 3.5–4.8)
ALK PHOS: 80 IU/L (ref 39–117)
ALT: 11 IU/L (ref 0–44)
AST: 25 IU/L (ref 0–40)
Albumin/Globulin Ratio: 0.9 — ABNORMAL LOW (ref 1.2–2.2)
BUN / CREAT RATIO: 4 — AB (ref 10–24)
BUN: 4 mg/dL — ABNORMAL LOW (ref 8–27)
Bilirubin Total: 0.4 mg/dL (ref 0.0–1.2)
CALCIUM: 9.3 mg/dL (ref 8.6–10.2)
CO2: 22 mmol/L (ref 20–29)
CREATININE: 0.96 mg/dL (ref 0.76–1.27)
Chloride: 97 mmol/L (ref 96–106)
GFR, EST AFRICAN AMERICAN: 91 mL/min/{1.73_m2} (ref >59)
GFR, EST NON AFRICAN AMERICAN: 79 mL/min/{1.73_m2} (ref >59)
GLOBULIN, TOTAL: 4.4 g/dL (ref 1.5–4.5)
Glucose: 93 mg/dL (ref 65–99)
Potassium: 4.2 mmol/L (ref 3.5–5.2)
SODIUM: 136 mmol/L (ref 134–144)
Total Protein: 8.4 g/dL (ref 6.0–8.5)

## 2017-01-10 ENCOUNTER — Telehealth: Payer: Self-pay | Admitting: Family Medicine

## 2017-01-10 MED ORDER — AMITRIPTYLINE HCL 75 MG PO TABS: 75.0000 mg | ORAL_TABLET | Freq: Every day | ORAL | 0 refills | Status: AC

## 2017-01-10 NOTE — Telephone Encounter (Signed)
Refill sent in and pt aware  

## 2017-01-30 DIAGNOSIS — R404 Transient alteration of awareness: Secondary | ICD-10-CM | POA: Diagnosis not present

## 2017-01-31 ENCOUNTER — Telehealth: Payer: Self-pay | Admitting: *Deleted

## 2017-01-31 NOTE — Telephone Encounter (Signed)
Pt passed away on 2016-12-18 at his home Family wants pt cremated Can Dr Darlyn ReadStacks sign death certificate Please advise

## 2017-01-31 NOTE — Telephone Encounter (Signed)
Dr Darlyn ReadStacks says he will sign the Death Certificate.

## 2017-01-31 NOTE — Telephone Encounter (Signed)
Notified funeral home that Dr Darlyn ReadStacks will sign death certificate

## 2017-02-06 DIAGNOSIS — 419620001 Death: Secondary | SNOMED CT | POA: Diagnosis not present

## 2017-02-06 DEATH — deceased

## 2017-04-06 ENCOUNTER — Ambulatory Visit: Payer: Medicare HMO | Admitting: Family Medicine
